# Patient Record
Sex: Female | Born: 1990 | Race: White | Hispanic: No | Marital: Married | Smoking: Former smoker
Health system: Southern US, Community
[De-identification: ages and names within clinical notes are randomized; demographics above are authoritative.]

## PROBLEM LIST (undated history)

## (undated) DIAGNOSIS — D649 Anemia, unspecified: Secondary | ICD-10-CM

## (undated) HISTORY — DX: Anemia, unspecified: D64.9

---
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
## 2012-02-05 ENCOUNTER — Emergency Department: Payer: Self-pay | Admitting: Emergency Medicine

## 2013-02-04 ENCOUNTER — Emergency Department: Payer: Self-pay | Admitting: Emergency Medicine

## 2013-02-04 LAB — CBC
HCT: 40.3 % (ref 35.0–47.0)
MCH: 28 pg (ref 26.0–34.0)
MCHC: 32.9 g/dL (ref 32.0–36.0)
Platelet: 230 10*3/uL (ref 150–440)
RDW: 13.3 % (ref 11.5–14.5)
WBC: 13.5 10*3/uL — ABNORMAL HIGH (ref 3.6–11.0)

## 2013-02-04 LAB — COMPREHENSIVE METABOLIC PANEL
Anion Gap: 11 (ref 7–16)
BUN: 8 mg/dL (ref 7–18)
Calcium, Total: 9.2 mg/dL (ref 8.5–10.1)
Co2: 21 mmol/L (ref 21–32)
Glucose: 79 mg/dL (ref 65–99)
Osmolality: 269 (ref 275–301)
Potassium: 3.8 mmol/L (ref 3.5–5.1)
SGOT(AST): 20 U/L (ref 15–37)
Sodium: 136 mmol/L (ref 136–145)
Total Protein: 7.8 g/dL (ref 6.4–8.2)

## 2013-02-04 LAB — URINALYSIS, COMPLETE
Blood: NEGATIVE
Nitrite: NEGATIVE
RBC,UR: 1 /HPF (ref 0–5)
Specific Gravity: 1.015 (ref 1.003–1.030)
Squamous Epithelial: 10

## 2013-02-18 ENCOUNTER — Emergency Department: Payer: Self-pay | Admitting: Internal Medicine

## 2013-03-09 ENCOUNTER — Encounter: Payer: Self-pay | Admitting: Maternal and Fetal Medicine

## 2013-04-23 ENCOUNTER — Encounter: Payer: Self-pay | Admitting: Obstetrics & Gynecology

## 2013-04-26 ENCOUNTER — Encounter: Payer: Self-pay | Admitting: Obstetrics & Gynecology

## 2013-06-28 ENCOUNTER — Emergency Department: Payer: Self-pay | Admitting: Emergency Medicine

## 2013-07-31 ENCOUNTER — Emergency Department: Payer: Self-pay | Admitting: Emergency Medicine

## 2013-09-21 ENCOUNTER — Observation Stay: Payer: Self-pay | Admitting: Advanced Practice Midwife

## 2013-09-22 ENCOUNTER — Inpatient Hospital Stay: Payer: Self-pay | Admitting: Obstetrics & Gynecology

## 2013-09-22 LAB — CBC WITH DIFFERENTIAL/PLATELET
HCT: 36 % (ref 35.0–47.0)
Lymphocyte %: 14.4 %
MCHC: 33.5 g/dL (ref 32.0–36.0)
Monocyte #: 0.8 x10 3/mm (ref 0.2–0.9)
Monocyte %: 4.7 %
Neutrophil #: 13.3 10*3/uL — ABNORMAL HIGH (ref 1.4–6.5)
Neutrophil %: 80.1 %
Platelet: 189 10*3/uL (ref 150–440)
RDW: 14.9 % — ABNORMAL HIGH (ref 11.5–14.5)

## 2013-09-23 LAB — GC/CHLAMYDIA PROBE AMP

## 2013-09-24 LAB — HEMATOCRIT: HCT: 29.8 % — ABNORMAL LOW (ref 35.0–47.0)

## 2014-03-31 ENCOUNTER — Emergency Department: Payer: Self-pay | Admitting: Emergency Medicine

## 2015-03-18 NOTE — Op Note (Signed)
PATIENT NAME:  Adrienne Berry, Adrienne Berry MR#:  [id number] DATE OF BIRTH:  05/18/1991  DATE OF PROCEDURE:  09/23/2013  PREOPERATIVE DIAGNOSES: 1.  Term intrauterine pregnancy at [redacted] weeks gestation.  2.  Active phase arrest.   POSTOPERATIVE DIAGNOSES:   1.  Term intrauterine pregnancy at [redacted] weeks gestation.  2.  Active phase arrest.   PROCEDURE PERFORMED: Primary low transverse cesarean section via Pfannenstiel skin incision.   ANESTHESIA: Spinal epidural.   PRIMARY SURGEON: Florina Ou. Camrie Stock   ASSISTANT:  Joaquim Lai  certified nurse midwife.   ESTIMATED BLOOD LOSS: 700 mL.  OPERATIVE FLUIDS: 1 liter of crystalloid.   SPECIMENS REMOVED: None.   PREOPERATIVE ANTIBIOTICS: 3 grams Ancef.   DRAINS OR TUBES:  Foley to gravity drainage, On-Q catheter system.   COMPLICATIONS: None.   INTRAOPERATIVE FINDINGS: Normal tubes, ovaries, and uterus. Delivery resulting in the birth of a liveborn female infant weighing 3020 grams, 6 pounds 11 ounces. Apgars 8 and 9.   SPECIMENS REMOVED: None.   THE PATIENT'S CONDITION FOLLOWING PROCEDURES: Stable.   PROCEDURE IN DETAIL: Risks, benefits, and alternatives of the procedure were discussed with the patient prior to proceeding to the operating room. The patient was taken to the operating room where her epidural was dosed for a C-section. She was positioned in the supine position, prepped and draped in the usual sterile fashion. A timeout was performed. The level of anesthetic was checked and noted to be adequate prior to proceeding with the case. A Pfannenstiel skin incision was made 2 cm above the pubic symphysis, carried down sharply to the level of the rectus fascia using a scalpel. The fascia was incised in the midline with the scalpel and the fascial incision was extended using Mayo scissors. The superior border of the rectus fascia was grasped with two Kocher clamps. The underlying rectus muscles were dissected off the fascia using blunt dissection.  The median raphe was incised using Mayo scissors. The inferior border of the rectus fascia was dissected off the rectus muscles in a similar fashion. Midline was identified. The peritoneum was entered bluntly. The peritoneal incision was extended using manual traction. A large Alexis retractor was then placed to allow visualization of the operative field and lower uterine segment. A low transverse incision was made on the uterus and the uterus was entered bluntly using the operator's finger. The hysterotomy incision was extended using manual traction. Amniotomy did not need to be performed as the patient was ruptured but the fluid was noted to be clear. The vertex was grasped, flexed, brought to the incision, delivered atraumatically using fundal pressure. The infant was suctioned, cord was clamped and cut and the infant was passed to the awaiting pediatrician. Cord blood was obtained. The placenta was delivered using manual extraction. The uterus was repaired in situ using a 2 layer closure of 0 Vicryl with the first being a running locked, the second a vertical imbricating. The uterus was wiped clean of clots and debris with 2 moist laps prior to closure of the hysterotomy incision. The hysterotomy was inspected and noted to be hemostatic. The fascia was grasped with 2 Kocher clamps and the On-Q catheters were placed subfascially per the usual protocol. Fascia was closed using a single looped #1 PDS in a running fashion. The subcutaneous tissue was irrigated. The subcutaneous dead space was reapproximated using 2-0 chromic on a T53 needle. Subdermal sutures were placed to take tension off the incision using a 2-0 Vicryl. The skin was then closed using 4-0  Monocryl in a subcuticular fashion. Each On-Q catheter was bolused with 5 mL of 0.5% bupivacaine each. The incision was reinforced with Steri-Strips. Sponge, needle, and instrument counts were correct x 2. The patient tolerated the procedure well and was taken to  the recovery room in stable condition.   ____________________________ Florina OuAndreas M. Bonney AidStaebler, MD ams:dp D: 09/29/2013 15:06:58 ET T: 09/29/2013 15:26:21 ET JOB#: [id number]  cc: Florina OuAndreas M. Bonney AidStaebler, MD, <Dictator> Carmel SacramentoANDREAS Cathrine MusterM Maveryk Renstrom MD ELECTRONICALLY SIGNED 10/07/2013 8:56

## 2015-03-18 NOTE — Consult Note (Signed)
PATIENT NAME:  Adrienne Berry, Adrienne Berry MR#:  [id number] DATE OF BIRTH:  [birth year]  DATE OF CONSULTATION:  09/26/2013  REFERRING PHYSICIAN:  Dr. Patton SallesWeaver-Lee CONSULTING PHYSICIAN:  Duane LopeJeffrey D. Judithann SheenSparks, MD  REASON FOR CONSULTATION:  Rash.  HISTORY OF PRESENT ILLNESS:  The patient is a 24 year old female who is now three days postpartum after C-section with a delivery of a healthy baby boy.  Has developed a rash on her buttocks extending down into the posterior thighs which is extremely irritating and pruritic.  No fevers.  No nausea, vomiting.  Denies shortness of breath or cough.    PAST MEDICAL HISTORY:  Unremarkable.   MEDICATIONS ON ADMISSION:  None.   ALLERGIES:  No known drug allergies.   SOCIAL HISTORY:  Negative for alcohol or tobacco abuse.   REVIEW OF SYSTEMS:  As per HPI.   PHYSICAL EXAMINATION: GENERAL:  The patient is in no acute distress.  VITAL SIGNS:  Currently remarkable for a blood pressure of 120/60 with a heart rate of 92, temperature of 98.2.  HEENT:  Oropharynx clear.  NECK:  Supple.  No adenopathy.  LUNGS:  Clear.  CARDIAC:  Regular rate and rhythm.  Normal S1, S2.  ABDOMEN:  Soft and nontender.  EXTREMITIES:  Stasis changes with trace edema.  SKIN:  Reveals a diffuse, erythematous, warm rash noted on the buttocks bilaterally extending down to the posterior thighs.  There are some mild excoriations from where she has scratched.  There are no discrete vesicles.  No purulence.   ASSESSMENT:  Postpartum rash consistent with erysipelas.   PLAN:  The patient was instructed to keep the area clean and dry.  She can use Desitin ointment if needed.  Atarax 25 mg every four hours as needed for itching.  Begin Ceftin 500 mg by mouth twice daily for 10 days.  She should follow up with a dermatologist within 7 to 10 days, sooner if needed.   Thank you for the consultation.  Please call if questions arise.    ____________________________ Duane LopeJeffrey D. Judithann SheenSparks,  MD jds:ea D: 09/26/2013 19:14:48 ET T: 09/26/2013 23:57:36 ET JOB#: [id number]  cc: Duane LopeJeffrey D. Judithann SheenSparks, MD, <Dictator> Christa Fasig Rodena Medin Dwaine Pringle MD ELECTRONICALLY SIGNED 09/27/2013 10:53

## 2015-03-18 NOTE — Consult Note (Signed)
Chief Complaint:  Subjective/Chief Complaint Rash on buttocks and legs   VITAL SIGNS/ANCILLARY NOTES: **Vital Signs.:   01-Nov-14 04:07  Temperature Temperature (F) 98.6  Pulse Pulse 92  Systolic BP Systolic BP 127  Diastolic BP (mmHg) Diastolic BP (mmHg) 87    07:03  Temperature Temperature (F) 97.4  Pulse Pulse 92  Systolic BP Systolic BP 110  Diastolic BP (mmHg) Diastolic BP (mmHg) 73  Pulse Ox % Pulse Ox % 100  Oxygen Delivery Room Air/ 21 %    11:16  Temperature Temperature (F) 97.9  Pulse Pulse 92  Systolic BP Systolic BP 115  Diastolic BP (mmHg) Diastolic BP (mmHg) 79  Pulse Ox % Pulse Ox % 98  Oxygen Delivery Room Air/ 21 %    15:34  Temperature Temperature (F) 98.2  Pulse Pulse 107  Systolic BP Systolic BP 120  Diastolic BP (mmHg) Diastolic BP (mmHg) 62  Pulse Ox % Pulse Ox % 99  Oxygen Delivery Room Air/ 21 %   Brief Assessment:  GEN no acute distress   Cardiac Regular   Respiratory clear BS   Gastrointestinal Normal   Additional Physical Exam erythematous rash to buttocks and posterior thighs. No vesicles. Some warmth.   Assessment/Plan:  Assessment/Plan:  Assessment erysipelas   Plan Ceftin 500mg  po BID x 10 days f/u with Derm as outpatient in 7-10 days   Electronic Signatures: Marguarite ArbourSparks, Ciro Tashiro D (MD)  (Signed 01-Nov-14 19:11)  Authored: Chief Complaint, VITAL SIGNS/ANCILLARY NOTES, Brief Assessment, Assessment/Plan   Last Updated: 01-Nov-14 19:11 by Marguarite ArbourSparks, Peyton Rossner D (MD)

## 2015-04-05 NOTE — H&P (Signed)
L&D Evaluation:  History Expanded:  HPI 24 yo G1P0 at 3540 w 3 days who was seen last night also and was 2 cm . She is having very irregular contractions. HEr cervix tonight os three and patient is very comfortably talkign with family membersexcwept for ctx. She has a BMI of 40 and she is RH neg, she got tdap at 07/02/13. 1st trimester screenign was negative, the pt was a smoker but stopped with pregnancy her partner still smokes.   Gravida 1   Term 0   PreTerm 0   Abortion 0   Living 0   Blood Type (Maternal) O negative   Maternal HIV Negative   Maternal Syphilis Ab Nonreactive   Maternal Varicella Immune   Rubella Results (Maternal) unknown, needs titer   Maternal T-Dap Immune   [facility]   Presents with contractions   Patient's Medical History migraines   Patient's Surgical History none   Medications Pre Natal Vitamins  Valtrex   Allergies NKDA   Social History tobacco  before pregnancy   Family History Non-Contributory   Current Prenatal Course Notable For obesity,   ROS:  ROS All systems were reviewed.  HEENT, CNS, GI, GU, Respiratory, CV, Renal and Musculoskeletal systems were found to be normal.   Exam:  Vital Signs stable    Urine Protein not completed   General no apparent distress, x ctx   Mental Status clear    Chest clear    Heart normal sinus rhythm   Abdomen gravid, tender with contractions   Estimated Fetal Weight Average for gestational age   Fetal Position v   Back no CVAT   Edema no edema    Reflexes 1+    Clonus positive   Pelvic no external lesions   Mebranes Intact   FHT normal rate with no decels, cat i strictly reactiove   Fetal Heart Rate 140    Ucx regular   Skin dry   Lymph no lymphadenopathy    Impression:  Impression active labor   Plan:  Plan monitor contractions and for cervical change   Comments admit for labor desires epidural   Follow Up Appointment need to schedule. in 6 weeks    Electronic Signatures: Adria DevonKlett, Marvelous [place] (MD)  (Signed 28-Oct-14 22:03)  Authored: L&D Evaluation   Last Updated: 28-Oct-14 22:03 by Adria DevonKlett, [place] (MD)

## 2018-08-04 ENCOUNTER — Emergency Department
Admission: EM | Admit: 2018-08-04 | Discharge: 2018-08-04 | Disposition: A | Discharge status: Home / Self Care | Payer: Self-pay | Attending: Emergency Medicine | Admitting: Emergency Medicine

## 2018-08-04 ENCOUNTER — Other Ambulatory Visit: Payer: Self-pay

## 2018-08-04 ENCOUNTER — Encounter: Payer: Self-pay | Admitting: Emergency Medicine

## 2018-08-04 DIAGNOSIS — R112 Nausea with vomiting, unspecified: Secondary | ICD-10-CM | POA: Insufficient documentation

## 2018-08-04 DIAGNOSIS — M7918 Myalgia, other site: Secondary | ICD-10-CM | POA: Insufficient documentation

## 2018-08-04 DIAGNOSIS — F17200 Nicotine dependence, unspecified, uncomplicated: Secondary | ICD-10-CM | POA: Insufficient documentation

## 2018-08-04 LAB — URINALYSIS, COMPLETE (UACMP) WITH MICROSCOPIC
Bilirubin Urine: NEGATIVE
GLUCOSE, UA: NEGATIVE mg/dL
Ketones, ur: NEGATIVE mg/dL
LEUKOCYTES UA: NEGATIVE
Nitrite: NEGATIVE
PH: 6 (ref 5.0–8.0)
Protein, ur: 30 mg/dL — AB
SPECIFIC GRAVITY, URINE: 1.024 (ref 1.005–1.030)

## 2018-08-04 LAB — CBC
HCT: 38.8 % (ref 35.0–47.0)
Hemoglobin: 13.6 g/dL (ref 12.0–16.0)
MCH: 29.8 pg (ref 26.0–34.0)
MCHC: 35.1 g/dL (ref 32.0–36.0)
MCV: 84.8 fL (ref 80.0–100.0)
Platelets: 202 10*3/uL (ref 150–440)
RBC: 4.58 MIL/uL (ref 3.80–5.20)
RDW: 12.9 % (ref 11.5–14.5)
WBC: 8.9 10*3/uL (ref 3.6–11.0)

## 2018-08-04 LAB — COMPREHENSIVE METABOLIC PANEL
ALT: 31 U/L (ref 0–44)
AST: 23 U/L (ref 15–41)
Albumin: 3.9 g/dL (ref 3.5–5.0)
Alkaline Phosphatase: 89 U/L (ref 38–126)
Anion gap: 8 (ref 5–15)
BUN: 9 mg/dL (ref 6–20)
CHLORIDE: 106 mmol/L (ref 98–111)
CO2: 25 mmol/L (ref 22–32)
Calcium: 8.9 mg/dL (ref 8.9–10.3)
Creatinine, Ser: 0.75 mg/dL (ref 0.44–1.00)
GFR calc Af Amer: >60 mL/min (ref >60)
Glucose, Bld: 99 mg/dL (ref 70–99)
Potassium: 3.6 mmol/L (ref 3.5–5.1)
Sodium: 139 mmol/L (ref 135–145)
Total Bilirubin: 0.7 mg/dL (ref 0.3–1.2)
Total Protein: 7.4 g/dL (ref 6.5–8.1)

## 2018-08-04 LAB — LIPASE, BLOOD: LIPASE: 21 U/L (ref 11–51)

## 2018-08-04 LAB — POCT PREGNANCY, URINE: Preg Test, Ur: NEGATIVE

## 2018-08-04 MED ORDER — ONDANSETRON 4 MG PO TBDP
4.0000 mg | ORAL_TABLET | Freq: Once | ORAL | Status: AC
  Administered 2018-08-04: 4 mg via ORAL
  Filled 2018-08-04: qty 1

## 2018-08-04 MED ORDER — ONDANSETRON 4 MG PO TBDP: 4.0000 mg | ORAL_TABLET | Freq: Three times a day (TID) | ORAL | 0 refills | Status: DC | PRN

## 2018-08-04 NOTE — ED Triage Notes (Signed)
Here for vomiting. Denies diarrhea. No abdominal pain just sore from vomiting. Is keeping liquids down. VSS. NAD. Unlabored.

## 2018-08-04 NOTE — ED Notes (Signed)
PO challenged with Sprite

## 2018-08-04 NOTE — ED Provider Notes (Signed)
[facility] Emergency Department Provider Note  Time seen: 10:42 AM  I have reviewed the triage vital signs and the nursing notes.   HISTORY  Chief Complaint Emesis    HPI Adrienne Berry is a 27 y.o. female with no past medical history presents to the emergency department for nausea vomiting since yesterday.  According to the patient since yesterday morning she has been nauseated with frequent episodes of vomiting.  Patient denies any diarrhea denies any fever.  States she has had some body aches.  Patient states she thought it would go away overnight but woke up continuing to feel nauseated.  States she has been able to drink some ginger ale today but due to the continued feeling of nausea she came to the emergency department for evaluation.  Patient denies any abdominal pain fever or diarrhea.  Largely negative review of systems.   History reviewed. No pertinent past medical history.  There are no active problems to display for this patient.   Past Surgical History:  Procedure Laterality Date  . CESAREAN SECTION      Prior to Admission medications   Not on File    No Known Allergies  History reviewed. No pertinent family history.  Social History Social History   Tobacco Use  . Smoking status: Current Every Day Smoker  . Smokeless tobacco: Never Used  Substance Use Topics  . Alcohol use: Never    Frequency: Never  . Drug use: Never    Review of Systems Constitutional: Negative for fever. Cardiovascular: Negative for chest pain. Respiratory: Negative for shortness of breath. Gastrointestinal: Negative for abdominal pain.  Positive for nausea vomiting.  Negative for diarrhea. Genitourinary: Negative for urinary compaints.  Last menstrual period 2 weeks ago.  Denies vaginal discharge. Musculoskeletal: Negative for musculoskeletal complaints Skin: Negative for skin complaints  Neurological: Negative for headache All other ROS  negative  ____________________________________________   PHYSICAL EXAM:  VITAL SIGNS: ED Triage Vitals  Enc Vitals Group     BP 08/04/18 0930 (!) 117/47     Pulse Rate 08/04/18 0930 98     Resp 08/04/18 0930 20     Temp 08/04/18 0930 98.1 F (36.7 C)     Temp Source 08/04/18 0930 Oral     SpO2 08/04/18 0930 100 %     Weight 08/04/18 0930 (!) 305 lb (138.3 kg)     Height 08/04/18 0930 6' (1.829 m)     Head Circumference --      Peak Flow --      Pain Score 08/04/18 0931 0     Pain Loc --      Pain Edu? --      Excl. in GC? --    Constitutional: Alert and oriented. Well appearing and in no distress. Eyes: Normal exam ENT   Head: Normocephalic and atraumatic.   Mouth/Throat: Mucous membranes are moist. Cardiovascular: Normal rate, regular rhythm. No murmur Respiratory: Normal respiratory effort without tachypnea nor retractions. Breath sounds are clear  Gastrointestinal: Soft and nontender. No distention.  Musculoskeletal: Nontender with normal range of motion in all extremities.  Neurologic:  Normal speech and language. No gross focal neurologic deficits  Skin:  Skin is warm, dry and intact.  Psychiatric: Mood and affect are normal.   ____________________________________________   INITIAL IMPRESSION / ASSESSMENT AND PLAN / ED COURSE  Pertinent labs & imaging results that were available during my care of the patient were reviewed by me and considered in my medical decision  making (see chart for details).  Patient presents to the emergency department for nausea vomiting since yesterday morning.  Overall the patient appears very well.  Labs are reassuring.  I discussed with the patient IV placement for medications and fluids, patient states she has been able to keep down ginger ale today, and would prefer to try taking a nausea tablet.  We will dose ODT Zofran and attempt to have the patient drink oral fluids.  As long as the patient is able to adequately hydrate  without significant nausea or vomiting I anticipate likely discharge home with PCP follow-up.  Patient agreeable to plan of care.  Patient is feeling much better.  We will discharge with Zofran and PCP follow-up.  I discussed return precautions.  ____________________________________________   FINAL CLINICAL IMPRESSION(S) / ED DIAGNOSES  Nausea  Vomiting    Minna Antis, MD 08/04/18 1200

## 2019-08-10 ENCOUNTER — Ambulatory Visit: Payer: Medicaid Other | Admitting: Maternal Newborn

## 2019-08-19 ENCOUNTER — Ambulatory Visit (INDEPENDENT_AMBULATORY_CARE_PROVIDER_SITE_OTHER): Payer: Medicaid Other | Admitting: Maternal Newborn

## 2019-08-19 ENCOUNTER — Other Ambulatory Visit: Payer: Self-pay

## 2019-08-19 ENCOUNTER — Other Ambulatory Visit (HOSPITAL_COMMUNITY)
Admission: RE | Admit: 2019-08-19 | Discharge: 2019-08-19 | Disposition: A | Discharge status: Home / Self Care | Payer: Self-pay | Source: Ambulatory Visit | Attending: Maternal Newborn | Admitting: Maternal Newborn

## 2019-08-19 ENCOUNTER — Encounter: Payer: Self-pay | Admitting: Maternal Newborn

## 2019-08-19 VITALS — BP 120/80 | Ht 72.0 in | Wt 302.0 lb

## 2019-08-19 DIAGNOSIS — Z130 Encounter for screening for diseases of the blood and blood-forming organs and certain disorders involving the immune mechanism: Secondary | ICD-10-CM

## 2019-08-19 DIAGNOSIS — R102 Pelvic and perineal pain: Secondary | ICD-10-CM | POA: Insufficient documentation

## 2019-08-19 DIAGNOSIS — N939 Abnormal uterine and vaginal bleeding, unspecified: Secondary | ICD-10-CM

## 2019-08-19 DIAGNOSIS — Z124 Encounter for screening for malignant neoplasm of cervix: Secondary | ICD-10-CM

## 2019-08-19 DIAGNOSIS — Z0000 Encounter for general adult medical examination without abnormal findings: Secondary | ICD-10-CM

## 2019-08-19 DIAGNOSIS — Z01419 Encounter for gynecological examination (general) (routine) without abnormal findings: Secondary | ICD-10-CM

## 2019-08-19 HISTORY — DX: Abnormal uterine and vaginal bleeding, unspecified: N93.9

## 2019-08-19 NOTE — Progress Notes (Signed)
Gynecology Annual Exam  PCP: Patient, No Pcp Per  Chief Complaint:  Chief Complaint  Patient presents with  . Gynecologic Exam  . Menstrual Problem  . Abdominal Pain    History of Present Illness: Patient is a 28 y.o. G1P1001 presenting for an annual exam.   LMP: 08/05/2019 Average Interval: irregular, every 1.5 to 2 weeks Duration of flow: 6 days Heavy Menses: yes Clots: yes Intermenstrual Bleeding: no Postcoital Bleeding: no Dysmenorrhea: yes  She reports that she has had irregular cycles for the past 6 months, with cycles every 1.5-2 weeks (occasionally has a month with a regular cycle which is 23 days for her). Her bleeding lasts for 6 days, with heavy bleeding/clots for about 4 days per cycle. During this time, she has also had intermittent sharp pain on the right side of her pelvis, which happens while the second monthly cycle is ongoing. She rates the pain as 6/10. She has tried Midol, which lessened the pain a little but did not relieve it.  She reports that her appetite has been low and she has lost 13 lb in a week.  The patient is sexually active. She currently uses none for contraception. She has mild dyspareunia.  The patient does perform self breast exams.  There is no notable family history of breast or ovarian cancer in her family.  The patient wears seatbelts: yes.   The patient has regular exercise: no, but recently has started a job where she is active at work.    The patient does not have current symptoms of depression.    Domestic violence screening is negative.  Review of Systems  Constitutional: Positive for malaise/fatigue and weight loss.  HENT: Negative.   Eyes: Negative.   Respiratory: Negative for shortness of breath and wheezing.   Cardiovascular: Negative for chest pain and palpitations.  Gastrointestinal: Negative.   Genitourinary:       Abnormal uterine bleeding, heavy cycles every 2 weeks Right sided pelvic pain  Musculoskeletal:  Negative.   Skin: Negative.   Neurological: Negative.   Endo/Heme/Allergies: Negative.   Psychiatric/Behavioral: Negative.   All other systems reviewed and are negative.   Past Medical History:  History reviewed. No pertinent past medical history.  Past Surgical History:  Past Surgical History:  Procedure Laterality Date  . CESAREAN SECTION      Gynecologic History:  Contraception: none Last Pap: 6 years ago  Obstetric History: G1P1001  Family History:  History reviewed. No pertinent family history.  Social History:  Social History   Socioeconomic History  . Marital status: Married    Spouse name: Not on file  . Number of children: Not on file  . Years of education: Not on file  . Highest education level: Not on file  Occupational History  . Not on file  Social Needs  . Financial resource strain: Not on file  . Food insecurity    Worry: Not on file    Inability: Not on file  . Transportation needs    Medical: Not on file    Non-medical: Not on file  Tobacco Use  . Smoking status: Current Every Day Smoker  . Smokeless tobacco: Never Used  Substance and Sexual Activity  . Alcohol use: Never    Frequency: Never  . Drug use: Never  . Sexual activity: Yes    Birth control/protection: None  Lifestyle  . Physical activity    Days per week: Not on file    Minutes per session: Not  on file  . Stress: Not on file  Relationships  . Social Herbalist on phone: Not on file    Gets together: Not on file    Attends religious service: Not on file    Active member of club or organization: Not on file    Attends meetings of clubs or organizations: Not on file    Relationship status: Not on file  . Intimate partner violence    Fear of current or ex partner: Not on file    Emotionally abused: Not on file    Physically abused: Not on file    Forced sexual activity: Not on file  Other Topics Concern  . Not on file  Social History Narrative  . Not on file     Allergies:  No Known Allergies  Medications: Prior to Admission medications   Medication Sig Start Date End Date Taking? Authorizing Provider  ondansetron (ZOFRAN ODT) 4 MG disintegrating tablet Take 1 tablet (4 mg total) by mouth every 8 (eight) hours as needed for nausea or vomiting. 08/04/18   Harvest Dark, MD    Physical Exam Vitals: Blood pressure 120/80, height 6' (1.829 m), weight (!) 302 lb (137 kg).  Physical Exam Constitutional:      General: She is not in acute distress.    Appearance: Normal appearance. She is obese.  Genitourinary:     Vulva, urethra, cervix, uterus and rectum normal.     No vaginal discharge.     Uterus is not enlarged or tender.     Left adnexa are non-palpable.     Right adnexal mass present.     Right adnexa tender.     Left adnexa not tender.     Genitourinary Comments: Exam limited by body habitus  HENT:     Head: Normocephalic.  Neck:     Musculoskeletal: Normal range of motion and neck supple.  Cardiovascular:     Rate and Rhythm: Normal rate and regular rhythm.     Heart sounds: No murmur. No friction rub. No gallop.   Pulmonary:     Effort: Pulmonary effort is normal.     Breath sounds: Normal breath sounds.  Abdominal:     General: There is no distension.     Palpations: Abdomen is soft.     Tenderness: There is no abdominal tenderness.  Musculoskeletal: Normal range of motion.  Neurological:     Mental Status: She is oriented to person, place, and time.  Skin:    General: Skin is warm and dry.  Psychiatric:        Mood and Affect: Mood normal.        Behavior: Behavior normal.    Assessment: 28 y.o. G1P1001 annual exam with abnormal uterine bleeding and pelvic pain.  Plan: Problem List Items Addressed This Visit      Genitourinary   Abnormal uterine bleeding     Other   Pelvic pain   Relevant Orders   US PELVIS TRANSVAGINAL NON-OB (TV ONLY)    Other Visit Diagnoses    Encounter for annual routine  gynecological examination    -  Primary   Screening for iron deficiency anemia       Relevant Orders   CBC   Pap smear for cervical cancer screening       Relevant Orders   Cytology - PAP      1) STI screening was offered and declined  2) ASCCP guidelines and rationale discussed.  Patient  opts for every 3 year screening interval; Pap done today  3) Contraception - She is trying to conceive.  4) Screen for anemia today  5) Pelvic ultrasound ordered for pelvic pain and irregular bleeding, tenderness and palpable mass on right side during exam.  6) Discussed OCP as therapy for irregular, heavy menses, but she is trying to conceive and would prefer to wait for results of pelvic ultrasound before deciding on therapy.  Marcelyn Bruins, CNM 08/19/2019  11:54 AM

## 2019-08-20 LAB — CBC
Hematocrit: 41 % (ref 34.0–46.6)
Hemoglobin: 13.4 g/dL (ref 11.1–15.9)
MCH: 28.8 pg (ref 26.6–33.0)
MCHC: 32.7 g/dL (ref 31.5–35.7)
MCV: 88 fL (ref 79–97)
Platelets: 241 10*3/uL (ref 150–450)
RBC: 4.66 x10E6/uL (ref 3.77–5.28)
RDW: 12.4 % (ref 11.7–15.4)
WBC: 10.6 10*3/uL (ref 3.4–10.8)

## 2019-09-02 ENCOUNTER — Ambulatory Visit (LOCAL_COMMUNITY_HEALTH_CENTER): Payer: Self-pay

## 2019-09-02 ENCOUNTER — Other Ambulatory Visit: Payer: Self-pay

## 2019-09-02 VITALS — BP 110/70 | Ht 72.0 in | Wt 297.5 lb

## 2019-09-02 DIAGNOSIS — Z3201 Encounter for pregnancy test, result positive: Secondary | ICD-10-CM

## 2019-09-02 LAB — PREGNANCY, URINE: Preg Test, Ur: POSITIVE — AB

## 2019-09-02 NOTE — Progress Notes (Signed)
PNV not dispensed as currently taking PNV with iron gummies. Rich Number, RN

## 2019-09-09 ENCOUNTER — Ambulatory Visit (INDEPENDENT_AMBULATORY_CARE_PROVIDER_SITE_OTHER): Payer: Self-pay | Admitting: Obstetrics and Gynecology

## 2019-09-09 ENCOUNTER — Encounter: Payer: Self-pay | Admitting: Obstetrics and Gynecology

## 2019-09-09 ENCOUNTER — Other Ambulatory Visit: Payer: Self-pay

## 2019-09-09 VITALS — BP 109/61 | Wt 299.0 lb

## 2019-09-09 DIAGNOSIS — N912 Amenorrhea, unspecified: Secondary | ICD-10-CM

## 2019-09-09 DIAGNOSIS — Z3A01 Less than 8 weeks gestation of pregnancy: Secondary | ICD-10-CM

## 2019-09-09 DIAGNOSIS — Z23 Encounter for immunization: Secondary | ICD-10-CM

## 2019-09-09 DIAGNOSIS — O34219 Maternal care for unspecified type scar from previous cesarean delivery: Secondary | ICD-10-CM

## 2019-09-09 DIAGNOSIS — O99211 Obesity complicating pregnancy, first trimester: Secondary | ICD-10-CM

## 2019-09-09 DIAGNOSIS — Z3689 Encounter for other specified antenatal screening: Secondary | ICD-10-CM

## 2019-09-09 DIAGNOSIS — Z3201 Encounter for pregnancy test, result positive: Secondary | ICD-10-CM

## 2019-09-09 DIAGNOSIS — Z3480 Encounter for supervision of other normal pregnancy, unspecified trimester: Secondary | ICD-10-CM

## 2019-09-09 DIAGNOSIS — Z98891 History of uterine scar from previous surgery: Secondary | ICD-10-CM

## 2019-09-09 DIAGNOSIS — O99210 Obesity complicating pregnancy, unspecified trimester: Secondary | ICD-10-CM

## 2019-09-09 DIAGNOSIS — Z113 Encounter for screening for infections with a predominantly sexual mode of transmission: Secondary | ICD-10-CM

## 2019-09-09 LAB — POCT URINE PREGNANCY: Preg Test, Ur: POSITIVE — AB

## 2019-09-09 MED ORDER — FOLIC ACID 1 MG PO TABS: 1.0000 mg | ORAL_TABLET | Freq: Every day | ORAL | 10 refills | Status: DC

## 2019-09-09 NOTE — Progress Notes (Signed)
NOB- no concerns/flu shot today

## 2019-09-09 NOTE — Progress Notes (Signed)
New Obstetric Patient H&P    Chief Complaint: "Desires prenatal care"   History of Present Illness: Patient is a 28 y.o. G2P1001 Not Hispanic or Latino female, presents with amenorrhea and positive home pregnancy test. Patient's last menstrual period was 07/28/2019 (exact date). and based on her  LMP, her EDD is Estimated Date of Delivery: 05/03/20 and her EGA is 5169w1d. Her last pap smear on 08/19/2019 has not yet resulted.    She had a urine pregnancy test which was positive 1 week(s)  ago. Her last menstrual period was normal. Since her LMP she claims she has experienced minimal nausea. She denies vaginal bleeding. Her past medical history is contibutory for Body mass index is 40.55 kg/m.Marland Kitchen. 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 no  Genetic Screening/Teratology Counseling: (Includes patient, baby's father, or anyone in either family with:)   1. [facility]/= 735 [facility]  no 2. Thalassemia ([place], [place], Mediterranean, or Asian background): MCV<80  no 3. Neural tube defect (meningomyelocele, spina bifida, anencephaly)  no 4. Congenital heart defect  no  5. Down syndrome  no 6. Tay-Sachs (Jewish, [place])  no 7. Canavan's Disease  no 8. Sickle cell disease or trait (African)  no  9. Hemophilia or other blood disorders  no  10. Muscular dystrophy  no  11. Cystic fibrosis  no  12. Huntington's Chorea  no  13. Mental retardation/autism  no 14. Other inherited genetic or chromosomal disorder  no 15. Maternal metabolic disorder (DM, PKU,  etc)  no 16. Patient or FOB with a child with a birth defect not listed above no  16a. Patient or FOB with a birth defect themselves no 17. Recurrent pregnancy loss, or stillbirth  no  18. Any medications since LMP other than prenatal vitamins (include vitamins, supplements, OTC meds, drugs, alcohol)  no 19. Any other genetic/environmental exposure to discuss  no  Infection History:   1. Lives with someone with TB or TB exposed  no  2. Patient or partner has history of genital herpes  no 3. Rash or viral illness since LMP  no 4. History of STI (GC, CT, HPV, syphilis, HIV)  no 5. History of recent travel :  no  Other pertinent information:  no     Review of Systems:10 point review of systems negative unless otherwise noted in HPI  Past Medical History:  Past Medical History:  Diagnosis Date  . Anemia     Past Surgical History:  Past Surgical History:  Procedure Laterality Date  . CESAREAN SECTION      Gynecologic History: Patient's last menstrual period was 07/28/2019 (exact date).  Obstetric History: G2P1001  Family History:  Family History  Problem Relation Age of Onset  . Ovarian cysts Mother   . Ovarian cysts Maternal Aunt     Social History:  Social History   Socioeconomic History  . Marital status: Married    Spouse name: Not on file  . Number of children: Not on file  .  Years of education: Not on file  . Highest education level: Not on file  Occupational History  . Not on file  Social Needs  . Financial resource strain: Not on file  . Food insecurity    Worry: Not on file    Inability: Not on file  . Transportation needs    Medical: Not on file    Non-medical: Not on file  Tobacco Use  . Smoking status: Former Smoker    Packs/day: 0.50    Years: 12.00    Pack years: 6.00  . Smokeless tobacco: Never Used  Substance and Sexual Activity  . Alcohol use: Not Currently    Frequency: Never    Comment: Last ETOH use one month ago  . Drug use:  Never  . Sexual activity: Yes    Partners: Male    Birth control/protection: None  Lifestyle  . Physical activity    Days per week: Not on file    Minutes per session: Not on file  . Stress: Not on file  Relationships  . Social Musician on phone: Not on file    Gets together: Not on file    Attends religious service: Not on file    Active member of club or organization: Not on file    Attends meetings of clubs or organizations: Not on file    Relationship status: Not on file  . Intimate partner violence    Fear of current or ex partner: No    Emotionally abused: No    Physically abused: No    Forced sexual activity: No  Other Topics Concern  . Not on file  Social History Narrative  . Not on file    Allergies:  No Known Allergies  Medications: Prior to Admission medications   Medication Sig Start Date End Date Taking? Authorizing Provider  prenatal vitamin w/FE, FA (NATACHEW) 29-1 MG CHEW chewable tablet Chew 1 tablet by mouth daily at 12 noon. Taking Prenatal Vitamin with iron gummies.   Yes [provider]  ondansetron (ZOFRAN ODT) 4 MG disintegrating tablet Take 1 tablet (4 mg total) by mouth every 8 (eight) hours as needed for nausea or vomiting. Patient not taking: Reported on 08/19/2019 08/04/18   Minna Antis, MD    Physical Exam Vitals: Blood pressure 109/61, weight 299 lb (135.6 kg), last menstrual period 07/28/2019. Body mass index is 40.55 kg/m.  General: NAD, well nourished, appears stated age HEENT: normocephalic, anicteric Pulmonary: No increased work of breathing, CTAB Neurologic: Grossly intact Psychiatric: mood appropriate, affect full   Assessment: 28 y.o. G2P1001 at [redacted]w[redacted]d presenting to initiate prenatal care  Plan: 1) Avoid alcoholic beverages. 2) Patient encouraged not to smoke.  3) Discontinue the use of all non-medicinal drugs and chemicals.  4) Take prenatal vitamins daily.  5) Nutrition, food safety (fish, cheese  advisories, and high nitrite foods) and exercise discussed. 6) Hospital and practice style discussed with cross coverage system.  7) Genetic Screening, such as with 1st Trimester Screening, cell free fetal DNA, AFP testing, and Ultrasound, as well as with amniocentesis and CVS as appropriate, is discussed with patient. At the conclusion of today's visit patient requested genetic testing 8) Consider starting low dose ASA at >[redacted] weeks gestation as per USPTF recommendation "Low-Dose Aspirin Use for the Prevention of Morbidity and Mortality From Preeclampsia: Preventive Medicine"  furthermore endorsed by ACOG, WHO, and NIH based on evidence level B for the prevention of preeclampsia  In women deemed high risk  (  diabetes, renal disease, chronic hypertension, history of preeclampsia in prior gestation, autoimmune diseases, or multifetal gestations). 9) Body mass index is 40.55 kg/m. - additional folic acid 1g.  Baseline CMP, P/C ratio 10) Patient desires to deliver at [facility] - referal faculty practice 11) Return in about 1 week (around 09/16/2019) for [place] and dating scan.    Malachy Mood, MD, [facility], [facility] 09/09/2019, 11:08 AM

## 2019-09-11 LAB — COMPREHENSIVE METABOLIC PANEL
ALT: 35 IU/L — ABNORMAL HIGH (ref 0–32)
AST: 25 IU/L (ref 0–40)
Albumin/Globulin Ratio: 1.5 (ref 1.2–2.2)
Albumin: 4.4 g/dL (ref 3.9–5.0)
Alkaline Phosphatase: 104 IU/L (ref 39–117)
BUN/Creatinine Ratio: 8 — ABNORMAL LOW (ref 9–23)
BUN: 6 mg/dL (ref 6–20)
Bilirubin Total: 0.6 mg/dL (ref 0.0–1.2)
CO2: 18 mmol/L — ABNORMAL LOW (ref 20–29)
Calcium: 9.4 mg/dL (ref 8.7–10.2)
Chloride: 104 mmol/L (ref 96–106)
Creatinine, Ser: 0.72 mg/dL (ref 0.57–1.00)
GFR calc Af Amer: 133 mL/min/{1.73_m2} (ref >59)
GFR calc non Af Amer: 115 mL/min/{1.73_m2} (ref >59)
Globulin, Total: 2.9 g/dL (ref 1.5–4.5)
Glucose: 81 mg/dL (ref 65–99)
Potassium: 4.1 mmol/L (ref 3.5–5.2)
Sodium: 137 mmol/L (ref 134–144)
Total Protein: 7.3 g/dL (ref 6.0–8.5)

## 2019-09-11 LAB — URINE CULTURE

## 2019-09-11 LAB — RPR+RH+ABO+RUB AB+AB SCR+CB...
Antibody Screen: NEGATIVE
HIV Screen 4th Generation wRfx: NONREACTIVE
Hematocrit: 39.8 % (ref 34.0–46.6)
Hemoglobin: 13.1 g/dL (ref 11.1–15.9)
Hepatitis B Surface Ag: NEGATIVE
MCH: 27.6 pg (ref 26.6–33.0)
MCHC: 32.9 g/dL (ref 31.5–35.7)
MCV: 84 fL (ref 79–97)
Platelets: 247 10*3/uL (ref 150–450)
RBC: 4.75 x10E6/uL (ref 3.77–5.28)
RDW: 12.4 % (ref 11.7–15.4)
RPR Ser Ql: NONREACTIVE
Rh Factor: NEGATIVE
Rubella Antibodies, IGG: 4.34 index (ref >0.99)
Varicella zoster IgG: 1524 index (ref >165)
WBC: 11.6 10*3/uL — ABNORMAL HIGH (ref 3.4–10.8)

## 2019-09-11 LAB — HEMOGLOBIN A1C
Est. average glucose Bld gHb Est-mCnc: 105 mg/dL
Hgb A1c MFr Bld: 5.3 % (ref 4.8–5.6)

## 2019-09-18 ENCOUNTER — Encounter: Payer: Self-pay | Admitting: Obstetrics and Gynecology

## 2019-09-18 DIAGNOSIS — Z6791 Unspecified blood type, Rh negative: Secondary | ICD-10-CM | POA: Insufficient documentation

## 2019-09-18 DIAGNOSIS — O26899 Other specified pregnancy related conditions, unspecified trimester: Secondary | ICD-10-CM | POA: Insufficient documentation

## 2019-09-21 ENCOUNTER — Ambulatory Visit (INDEPENDENT_AMBULATORY_CARE_PROVIDER_SITE_OTHER): Payer: Medicaid Other | Admitting: Obstetrics and Gynecology

## 2019-09-21 ENCOUNTER — Ambulatory Visit (INDEPENDENT_AMBULATORY_CARE_PROVIDER_SITE_OTHER): Payer: Medicaid Other

## 2019-09-21 ENCOUNTER — Other Ambulatory Visit: Payer: Self-pay

## 2019-09-21 ENCOUNTER — Other Ambulatory Visit: Payer: Self-pay | Admitting: Obstetrics & Gynecology

## 2019-09-21 ENCOUNTER — Encounter: Payer: Self-pay | Admitting: Obstetrics and Gynecology

## 2019-09-21 VITALS — BP 126/82 | Wt 295.0 lb

## 2019-09-21 DIAGNOSIS — Z3689 Encounter for other specified antenatal screening: Secondary | ICD-10-CM

## 2019-09-21 DIAGNOSIS — Z3480 Encounter for supervision of other normal pregnancy, unspecified trimester: Secondary | ICD-10-CM

## 2019-09-21 DIAGNOSIS — N8311 Corpus luteum cyst of right ovary: Secondary | ICD-10-CM

## 2019-09-21 DIAGNOSIS — O219 Vomiting of pregnancy, unspecified: Secondary | ICD-10-CM

## 2019-09-21 DIAGNOSIS — Z3A01 Less than 8 weeks gestation of pregnancy: Secondary | ICD-10-CM

## 2019-09-21 DIAGNOSIS — O3481 Maternal care for other abnormalities of pelvic organs, first trimester: Secondary | ICD-10-CM | POA: Diagnosis not present

## 2019-09-21 DIAGNOSIS — Z6791 Unspecified blood type, Rh negative: Secondary | ICD-10-CM

## 2019-09-21 DIAGNOSIS — Z98891 History of uterine scar from previous surgery: Secondary | ICD-10-CM

## 2019-09-21 DIAGNOSIS — O34219 Maternal care for unspecified type scar from previous cesarean delivery: Secondary | ICD-10-CM

## 2019-09-21 DIAGNOSIS — O26891 Other specified pregnancy related conditions, first trimester: Secondary | ICD-10-CM

## 2019-09-21 DIAGNOSIS — O99210 Obesity complicating pregnancy, unspecified trimester: Secondary | ICD-10-CM

## 2019-09-21 DIAGNOSIS — O99211 Obesity complicating pregnancy, first trimester: Secondary | ICD-10-CM

## 2019-09-21 MED ORDER — BONJESTA 20-20 MG PO TBCR: 1.0000 | EXTENDED_RELEASE_TABLET | Freq: Two times a day (BID) | ORAL | 2 refills | Status: DC | PRN

## 2019-09-21 NOTE — Progress Notes (Signed)
Routine Prenatal Care Visit  Subjective  Adrienne Berry is a 28 y.o. G2P1001 at [redacted]w[redacted]d being seen today for ongoing prenatal care.  She is currently monitored for the following issues for this high-risk pregnancy and has Abnormal uterine bleeding; Pelvic pain; History of cesarean section, low transverse; Supervision of other normal pregnancy, antepartum; Maternal obesity, antepartum; Rh negative, antepartum; and Nausea and vomiting during pregnancy on their problem list.  ----------------------------------------------------------------------------------- Patient reports nausea   . Vag. Bleeding: None.   . Leaking Fluid denies.  U/S confirms SLIUP and correct gestational age ----------------------------------------------------------------------------------- The following portions of the patient's history were reviewed and updated as appropriate: allergies, current medications, past family history, past medical history, past social history, past surgical history and problem list. Problem list updated.  Objective  Blood pressure 126/82, weight 295 lb (133.8 kg), last menstrual period 07/28/2019. Pregravid weight 296 lb (134.3 kg) Total Weight Gain -1 lb (-0.454 kg) Urinalysis: Urine Protein    Urine Glucose    Fetal Status: Fetal Heart Rate (bpm): 144         General:  Alert, oriented and cooperative. Patient is in no acute distress.  Skin: Skin is warm and dry. No rash noted.   Cardiovascular: Normal heart rate noted  Respiratory: Normal respiratory effort, no problems with respiration noted  Abdomen: Soft, gravid, appropriate for gestational age.       Pelvic:  Cervical exam deferred        Extremities: Normal range of motion.     Mental Status: Normal mood and affect. Normal behavior. Normal judgment and thought content.   Imaging Results US Ob Comp Less 14 Wks  Result Date: 09/21/2019 Patient Name: Adrienne Berry DOB: 1991/09/17 MRN: [id number] ULTRASOUND REPORT Location:  [facility] Date of Service: 09/21/2019 Indications:dating Findings: Mason Jim intrauterine pregnancy is visualized with a CRL consistent with [redacted]w[redacted]d gestation, giving an (U/S) EDD of 05/05/2020. The (U/S) EDD is consistent with the clinically established EDD of 05/03/2020. FHR: 144 BPM CRL measurement: 12.6 mm Yolk sac is visualized and appears normal and early anatomy is normal. Amnion: visualized and appears normal Right Ovary is normal in appearance. Left Ovary is normal appearance. Corpus luteal cyst:  Right ovary Survey of the adnexa demonstrates no adnexal masses. There is free peritoneal fluid in the cul de sac. It measures 2.75 x 1.0 x 5.35 cm.  This is of uncertain significance. Impression: 1. [redacted]w[redacted]d Viable Singleton Intrauterine pregnancy by U/S. 2. (U/S) EDD is consistent with Clinically established EDD of 05/03/2020. Deanna Artis, RT There is a viable singleton gestation.  Detailed evaluation of the fetal anatomy is precluded by early gestational age.  It must be noted that a normal ultrasound particular at this early gestational age is unable to rule out fetal aneuploidy, risk of first trimester miscarriage, or anatomic birth defects. Thomasene Mohair, MD, [facility], [facility] 09/21/2019 8:42 AM      Assessment   28 y.o. G2P1001 at [redacted]w[redacted]d by  05/03/2020, by Last Menstrual Period presenting for routine prenatal visit  Plan   SECOND Problems (from 09/09/19 to present)    Problem Noted Resolved   Nausea and vomiting during pregnancy 09/21/2019 by Conard Novak, MD No   Rh negative, antepartum 09/18/2019 by Vena Austria, MD No   Overview Signed 09/18/2019 10:50 AM by Vena Austria, MD    [ ]  Rhogam 28 weeks      History of cesarean section, low transverse 09/09/2019 by 09/11/2019, MD No  Overview Addendum 09/09/2019 11:10 AM by Malachy Mood, MD    Active phase arrest 3020 grams, 6 pounds 11 ounces      Supervision of other normal  pregnancy, antepartum 09/09/2019 by Malachy Mood, MD No   Overview Signed 09/18/2019 10:52 AM by Malachy Mood, MD    Clinic [facility] Prenatal Labs  Dating  Blood type: O negative  Genetic Screen 1 Screen:    AFP:     Quad:     NIPS: SMA:  FragileX:  CF: Antibody: Negative  Anatomic [place]  Rubella: Immune Varicella: Immune  GTT HgbA1C 5.3 RPR: NR  Rhogam  HBsAg: Negative  TDaP vaccine                       Flu Shot: HIV: Negative  Baby Food                                GBS:   Contraception  GBS prior pregnancy:  Pelvis Tested   Pap:  CS/VBAC    Support Person           Maternal obesity, antepartum 09/09/2019 by Malachy Mood, MD No       Preterm labor symptoms and general obstetric precautions including but not limited to vaginal bleeding, contractions, leaking of fluid and fetal movement were reviewed in detail with the patient. Please refer to After Visit Summary for other counseling recommendations.   Return in about 15 days (around 10/06/2019) for genetic screen lab and routine prenatal.  Prentice Docker, MD, [place], [facility] 09/21/2019 8:59 AM

## 2019-09-29 ENCOUNTER — Emergency Department: Payer: Medicaid Other

## 2019-09-29 ENCOUNTER — Emergency Department
Admission: EM | Admit: 2019-09-29 | Discharge: 2019-09-29 | Disposition: A | Discharge status: Home / Self Care | Payer: Medicaid Other | Attending: Emergency Medicine | Admitting: Emergency Medicine

## 2019-09-29 ENCOUNTER — Encounter: Payer: Self-pay | Admitting: Emergency Medicine

## 2019-09-29 ENCOUNTER — Other Ambulatory Visit: Payer: Self-pay

## 2019-09-29 DIAGNOSIS — O210 Mild hyperemesis gravidarum: Secondary | ICD-10-CM | POA: Diagnosis not present

## 2019-09-29 DIAGNOSIS — Z87891 Personal history of nicotine dependence: Secondary | ICD-10-CM | POA: Diagnosis not present

## 2019-09-29 DIAGNOSIS — Z3A09 9 weeks gestation of pregnancy: Secondary | ICD-10-CM | POA: Diagnosis not present

## 2019-09-29 DIAGNOSIS — O219 Vomiting of pregnancy, unspecified: Secondary | ICD-10-CM | POA: Diagnosis present

## 2019-09-29 DIAGNOSIS — R7989 Other specified abnormal findings of blood chemistry: Secondary | ICD-10-CM | POA: Insufficient documentation

## 2019-09-29 DIAGNOSIS — Z79899 Other long term (current) drug therapy: Secondary | ICD-10-CM | POA: Diagnosis not present

## 2019-09-29 LAB — HCG, QUANTITATIVE, PREGNANCY: hCG, Beta Chain, Quant, S: 171166 m[IU]/mL — ABNORMAL HIGH (ref <5)

## 2019-09-29 LAB — COMPREHENSIVE METABOLIC PANEL
ALT: 199 U/L — ABNORMAL HIGH (ref 0–44)
AST: 138 U/L — ABNORMAL HIGH (ref 15–41)
Albumin: 3.8 g/dL (ref 3.5–5.0)
Alkaline Phosphatase: 134 U/L — ABNORMAL HIGH (ref 38–126)
Anion gap: 13 (ref 5–15)
BUN: 9 mg/dL (ref 6–20)
CO2: 21 mmol/L — ABNORMAL LOW (ref 22–32)
Calcium: 9.5 mg/dL (ref 8.9–10.3)
Chloride: 100 mmol/L (ref 98–111)
Creatinine, Ser: 0.67 mg/dL (ref 0.44–1.00)
GFR calc Af Amer: >60 mL/min (ref >60)
GFR calc non Af Amer: >60 mL/min (ref >60)
Glucose, Bld: 85 mg/dL (ref 70–99)
Potassium: 3.7 mmol/L (ref 3.5–5.1)
Sodium: 134 mmol/L — ABNORMAL LOW (ref 135–145)
Total Bilirubin: 2.8 mg/dL — ABNORMAL HIGH (ref 0.3–1.2)
Total Protein: 8.2 g/dL — ABNORMAL HIGH (ref 6.5–8.1)

## 2019-09-29 LAB — URINALYSIS, COMPLETE (UACMP) WITH MICROSCOPIC
Glucose, UA: NEGATIVE mg/dL
Hgb urine dipstick: NEGATIVE
Ketones, ur: 80 mg/dL — AB
Nitrite: NEGATIVE
Protein, ur: 100 mg/dL — AB
Specific Gravity, Urine: 1.03 (ref 1.005–1.030)
pH: 6 (ref 5.0–8.0)

## 2019-09-29 LAB — CBC
HCT: 40.3 % (ref 36.0–46.0)
Hemoglobin: 13.5 g/dL (ref 12.0–15.0)
MCH: 28.1 pg (ref 26.0–34.0)
MCHC: 33.5 g/dL (ref 30.0–36.0)
MCV: 84 fL (ref 80.0–100.0)
Platelets: 262 10*3/uL (ref 150–400)
RBC: 4.8 MIL/uL (ref 3.87–5.11)
RDW: 12.2 % (ref 11.5–15.5)
WBC: 12.4 10*3/uL — ABNORMAL HIGH (ref 4.0–10.5)
nRBC: 0 % (ref 0.0–0.2)

## 2019-09-29 LAB — LIPASE, BLOOD: Lipase: 21 U/L (ref 11–51)

## 2019-09-29 MED ORDER — CEPHALEXIN 500 MG PO CAPS: 500.0000 mg | ORAL_CAPSULE | Freq: Two times a day (BID) | ORAL | 0 refills | Status: AC

## 2019-09-29 MED ORDER — SODIUM CHLORIDE 0.9 % IV BOLUS
1000.0000 mL | Freq: Once | INTRAVENOUS | Status: AC
  Administered 2019-09-29: 14:00:00 1000 mL via INTRAVENOUS

## 2019-09-29 MED ORDER — ONDANSETRON 8 MG PO TBDP: 8.0000 mg | ORAL_TABLET | Freq: Three times a day (TID) | ORAL | 0 refills | Status: DC | PRN

## 2019-09-29 MED ORDER — ONDANSETRON HCL 4 MG/2ML IJ SOLN
4.0000 mg | Freq: Once | INTRAMUSCULAR | Status: AC
  Administered 2019-09-29: 14:00:00 4 mg via INTRAVENOUS
  Filled 2019-09-29: qty 2

## 2019-09-29 NOTE — Discharge Instructions (Addendum)
Take the Zofran as needed for nausea, and gradually advance your diet as you are able to tolerate. Take the antibiotic as prescribed and finish the full course.    Follow-up with Dr. Georgianne Fick next week as scheduled.  Return to the ER immediately for new or worsening vomiting, inability to hold anything down, feeling weak, new or worsening abdominal pain, jaundice (yellowing of your eyes or of your skin), dark urine, or any other new or worsening symptoms that concern you.

## 2019-09-29 NOTE — ED Notes (Signed)
Patient transported to Ultrasound 

## 2019-09-29 NOTE — ED Triage Notes (Signed)
Pt in via POV, reports hyperemesis x approximately two weeks.  Pt is approximately [redacted] weeks pregnant.  States she is unable to keep anything down, becoming dizzy, light headed today.

## 2019-09-29 NOTE — ED Provider Notes (Signed)
[facility] Emergency Department Provider Note ____________________________________________   First MD Initiated Contact with Patient 09/29/19 1254     (approximate)  I have reviewed the triage vital signs and the nursing notes.   HISTORY  Chief Complaint Hyperemesis Gravidarum    HPI Adrienne Berry is a 28 y.o. female G2P1 at [redacted] weeks pregnant who presents with nausea and vomiting for the last 2 weeks, persistent course, and worsening over the last 5 days.  The patient states that in the last 5 days she has not been able to hold anything down.  She reports some mild occasional diarrhea.  She also reports upper abdominal pain under her ribs intermittently.  She denies associated fever, urinary symptoms, or vaginal bleeding.  The patient reports a previous history of hyperemesis during her last pregnancy.  Past Medical History:  Diagnosis Date  . Anemia     Patient Active Problem List   Diagnosis Date Noted  . Nausea and vomiting during pregnancy 09/21/2019  . Rh negative, antepartum 09/18/2019  . History of cesarean section, low transverse 09/09/2019  . Supervision of other normal pregnancy, antepartum 09/09/2019  . Maternal obesity, antepartum 09/09/2019  . Abnormal uterine bleeding 08/19/2019  . Pelvic pain 08/19/2019    Past Surgical History:  Procedure Laterality Date  . CESAREAN SECTION      Prior to Admission medications   Medication Sig Start Date End Date Taking? Authorizing Provider  cephALEXin (KEFLEX) 500 MG capsule Take 1 capsule (500 mg total) by mouth 2 (two) times daily for 7 days. 09/29/19 10/06/19  Arta Silence, MD  Doxylamine-Pyridoxine ER (BONJESTA) 20-20 MG TBCR Take 1 tablet by mouth 2 (two) times daily as needed. Sig 1 tab po  daily at bedtime, 1 tab po daily morning 09/21/19 11/20/19  Will Bonnet, MD  folic acid (FOLVITE) 1 MG tablet Take 1 tablet (1 mg total) by mouth daily. 09/09/19   Malachy Mood, MD   ondansetron (ZOFRAN ODT) 8 MG disintegrating tablet Take 1 tablet (8 mg total) by mouth every 8 (eight) hours as needed for nausea or vomiting. 09/29/19   Arta Silence, MD  prenatal vitamin w/FE, FA (NATACHEW) 29-1 MG CHEW chewable tablet Chew 1 tablet by mouth daily at 12 noon. Taking Prenatal Vitamin with iron gummies.    [provider]    Allergies Patient has no known allergies.  Family History  Problem Relation Age of Onset  . Ovarian cysts Mother   . Ovarian cysts Maternal Aunt     Social History Social History   Tobacco Use  . Smoking status: Former Smoker    Packs/day: 0.50    Years: 12.00    Pack years: 6.00  . Smokeless tobacco: Never Used  Substance Use Topics  . Alcohol use: Not Currently    Frequency: Never  . Drug use: Never    Review of Systems  Constitutional: No fever. Eyes: No visual changes. ENT: No sore throat. Cardiovascular: Denies chest pain. Respiratory: Denies shortness of breath. Gastrointestinal: Positive for nausea and vomiting. Genitourinary: Negative for dysuria.  Musculoskeletal: Negative for back pain. Skin: Negative for rash. Neurological: Negative for headache.   ____________________________________________   PHYSICAL EXAM:  VITAL SIGNS: ED Triage Vitals [09/29/19 1058]  Enc Vitals Group     BP 137/83     Pulse Rate 96     Resp 16     Temp 97.7 F (36.5 C)     Temp Source Oral     SpO2 98 %  Weight 279 lb (126.6 kg)     Height      Head Circumference      Peak Flow      Pain Score 0     Pain Loc      Pain Edu?      Excl. in [place]?     Constitutional: Alert and oriented.  Relative well appearing and in no acute distress. Eyes: Conjunctivae are normal.  No scleral icterus. Head: Atraumatic. Nose: No congestion/rhinnorhea. Mouth/Throat: Mucous membranes are somewhat dry.   Neck: Normal range of motion.  Cardiovascular: Normal rate, regular rhythm. Good peripheral circulation. Respiratory: Normal  respiratory effort.  No retractions.  Gastrointestinal: Mild right upper quadrant tenderness.  No distention.  Genitourinary: No flank tenderness. Musculoskeletal: Extremities warm and well perfused.  Neurologic:  Normal speech and language. No gross focal neurologic deficits are appreciated.  Skin:  Skin is warm and dry. No rash noted. Psychiatric: Mood and affect are normal. Speech and behavior are normal.  ____________________________________________   LABS (all labs ordered are listed, but only abnormal results are displayed)  Labs Reviewed  COMPREHENSIVE METABOLIC PANEL - Abnormal; Notable for the following components:      Result Value   Sodium 134 (*)    CO2 21 (*)    Total Protein 8.2 (*)    AST 138 (*)    ALT 199 (*)    Alkaline Phosphatase 134 (*)    Total Bilirubin 2.8 (*)    All other components within normal limits  CBC - Abnormal; Notable for the following components:   WBC 12.4 (*)    All other components within normal limits  URINALYSIS, COMPLETE (UACMP) WITH MICROSCOPIC - Abnormal; Notable for the following components:   Color, Urine AMBER (*)    APPearance HAZY (*)    Bilirubin Urine SMALL (*)    Ketones, ur 80 (*)    Protein, ur 100 (*)    Leukocytes,Ua SMALL (*)    Bacteria, UA MANY (*)    All other components within normal limits  HCG, QUANTITATIVE, PREGNANCY - Abnormal; Notable for the following components:   hCG, Beta Chain, Quant, S 171,166 (*)    All other components within normal limits  LIPASE, BLOOD   ____________________________________________  EKG   ____________________________________________  RADIOLOGY  US abdomen RUQ: No gallstones or other acute abnormalities  ____________________________________________   PROCEDURES  Procedure(s) performed: No  Procedures  Critical Care performed: No ____________________________________________   INITIAL IMPRESSION / ASSESSMENT AND PLAN / ED COURSE  Pertinent labs & imaging results  that were available during my care of the patient were reviewed by me and considered in my medical decision making (see chart for details).  28 year old female G2P1 at 9 weeks today presents with nausea and vomiting over the last 2 weeks, worsening over the last several days, and associated with some upper abdominal pain.  On exam, the patient is relatively well-appearing.  Her vital signs are normal.  The abdomen is soft with mild right upper quadrant tenderness.  Overall presentation is most consistent with hyperemesis gravidarum, however lab work-up obtained from triage reveals elevated LFTs, alk phos, bilirubin, and WBC count which are all new when compared to labs from 2 weeks ago.  Therefore, differential includes cholecystitis or other acute hepatobiliary etiology.  We will give IV fluids, antiemetic, obtain right upper quadrant ultrasound and lipase, and reassess.  ----------------------------------------- 6:24 PM on 09/29/2019 -----------------------------------------  The lipase and right upper quadrant ultrasound were within normal limits.  I consulted Dr. Georgianne Fick from OB/GYN and discussed the case with him.  He advised that this elevation in the LFTs and bilirubin could be directly related to hyperemesis, but also could be reflective of an underlying issue such as Gilbert's syndrome.  He recommended that the patient follow-up as scheduled and that he would track her LFTs.  On reassessment, the patient was feeling much better after fluids and Zofran.  She was able to tolerate p.o. without difficulty, and was stable for discharge home.  I prescribed Zofran as well as Keflex due to the evidence of UTI on the urinalysis.  I discussed the results of the work-up and the plan of care with the patient, gave her thorough return precautions; she expressed understanding. ____________________________________________   FINAL CLINICAL IMPRESSION(S) / ED DIAGNOSES  Final diagnoses:  Elevated  LFTs  Hyperemesis gravidarum      NEW MEDICATIONS STARTED DURING THIS VISIT:  Discharge Medication List as of 09/29/2019  4:35 PM    START taking these medications   Details  cephALEXin (KEFLEX) 500 MG capsule Take 1 capsule (500 mg total) by mouth 2 (two) times daily for 7 days., Starting Tue 09/29/2019, Until Tue 10/06/2019, Normal    ondansetron (ZOFRAN ODT) 8 MG disintegrating tablet Take 1 tablet (8 mg total) by mouth every 8 (eight) hours as needed for nausea or vomiting., Starting Tue 09/29/2019, Normal         Note:  This document was prepared using Dragon voice recognition software and may include unintentional dictation errors.   Arta Silence, MD 09/29/19 1836

## 2019-09-29 NOTE — ED Notes (Signed)
Patient reports that she is feeling better, nausea has subsided.  Graham crackers and ginger ale were provided.

## 2019-09-29 NOTE — ED Notes (Signed)
Patient finished crackers and drink without complaint of nausea.

## 2019-10-06 ENCOUNTER — Ambulatory Visit (INDEPENDENT_AMBULATORY_CARE_PROVIDER_SITE_OTHER): Payer: Medicaid Other | Admitting: Certified Nurse Midwife

## 2019-10-06 ENCOUNTER — Other Ambulatory Visit: Payer: Self-pay

## 2019-10-06 VITALS — BP 120/80 | Wt 283.0 lb

## 2019-10-06 DIAGNOSIS — O210 Mild hyperemesis gravidarum: Secondary | ICD-10-CM

## 2019-10-06 DIAGNOSIS — Z1379 Encounter for other screening for genetic and chromosomal anomalies: Secondary | ICD-10-CM

## 2019-10-06 DIAGNOSIS — Z3A10 10 weeks gestation of pregnancy: Secondary | ICD-10-CM

## 2019-10-06 DIAGNOSIS — R7401 Elevation of levels of liver transaminase levels: Secondary | ICD-10-CM | POA: Insufficient documentation

## 2019-10-06 DIAGNOSIS — Z3480 Encounter for supervision of other normal pregnancy, unspecified trimester: Secondary | ICD-10-CM

## 2019-10-06 LAB — POCT URINALYSIS DIPSTICK OB: Glucose, UA: NEGATIVE

## 2019-10-06 MED ORDER — BONJESTA 20-20 MG PO TBCR: 1.0000 | EXTENDED_RELEASE_TABLET | Freq: Two times a day (BID) | ORAL | 2 refills | Status: AC | PRN

## 2019-10-06 NOTE — Progress Notes (Signed)
C/o n/v-dehydration, uti, liver enzymes low probably from not eating in a few days - in hosp for a day.rj

## 2019-10-07 LAB — COMPREHENSIVE METABOLIC PANEL
ALT: 114 IU/L — ABNORMAL HIGH (ref 0–32)
AST: 21 IU/L (ref 0–40)
Albumin/Globulin Ratio: 1.4 (ref 1.2–2.2)
Albumin: 3.8 g/dL — ABNORMAL LOW (ref 3.9–5.0)
Alkaline Phosphatase: 155 IU/L — ABNORMAL HIGH (ref 39–117)
BUN/Creatinine Ratio: 13 (ref 9–23)
BUN: 9 mg/dL (ref 6–20)
Bilirubin Total: 0.5 mg/dL (ref 0.0–1.2)
CO2: 22 mmol/L (ref 20–29)
Calcium: 8.8 mg/dL (ref 8.7–10.2)
Chloride: 102 mmol/L (ref 96–106)
Creatinine, Ser: 0.69 mg/dL (ref 0.57–1.00)
GFR calc Af Amer: 138 mL/min/{1.73_m2} (ref >59)
GFR calc non Af Amer: 120 mL/min/{1.73_m2} (ref >59)
Globulin, Total: 2.8 g/dL (ref 1.5–4.5)
Glucose: 86 mg/dL (ref 65–99)
Potassium: 4.3 mmol/L (ref 3.5–5.2)
Sodium: 137 mmol/L (ref 134–144)
Total Protein: 6.6 g/dL (ref 6.0–8.5)

## 2019-10-11 LAB — MATERNIT 21 PLUS CORE, BLOOD
Fetal Fraction: 7
Result (T21): NEGATIVE
Trisomy 13 (Patau syndrome): NEGATIVE
Trisomy 18 (Edwards syndrome): NEGATIVE
Trisomy 21 (Down syndrome): NEGATIVE

## 2019-10-14 ENCOUNTER — Observation Stay
Admission: EM | Admit: 2019-10-14 | Discharge: 2019-10-15 | Disposition: A | Discharge status: Home / Self Care | Payer: Medicaid Other | Attending: Family Medicine | Admitting: Family Medicine

## 2019-10-14 ENCOUNTER — Encounter: Payer: Self-pay | Admitting: Family Medicine

## 2019-10-14 ENCOUNTER — Encounter: Payer: Self-pay | Admitting: Certified Nurse Midwife

## 2019-10-14 ENCOUNTER — Encounter: Payer: Self-pay | Admitting: Medical Oncology

## 2019-10-14 ENCOUNTER — Other Ambulatory Visit: Payer: Self-pay

## 2019-10-14 DIAGNOSIS — K921 Melena: Secondary | ICD-10-CM | POA: Diagnosis not present

## 2019-10-14 DIAGNOSIS — Z842 Family history of other diseases of the genitourinary system: Secondary | ICD-10-CM | POA: Diagnosis not present

## 2019-10-14 DIAGNOSIS — O99611 Diseases of the digestive system complicating pregnancy, first trimester: Secondary | ICD-10-CM | POA: Diagnosis not present

## 2019-10-14 DIAGNOSIS — Z79899 Other long term (current) drug therapy: Secondary | ICD-10-CM | POA: Insufficient documentation

## 2019-10-14 DIAGNOSIS — Z87891 Personal history of nicotine dependence: Secondary | ICD-10-CM | POA: Insufficient documentation

## 2019-10-14 DIAGNOSIS — O99211 Obesity complicating pregnancy, first trimester: Secondary | ICD-10-CM | POA: Insufficient documentation

## 2019-10-14 DIAGNOSIS — Z3480 Encounter for supervision of other normal pregnancy, unspecified trimester: Secondary | ICD-10-CM

## 2019-10-14 DIAGNOSIS — Z87440 Personal history of urinary (tract) infections: Secondary | ICD-10-CM | POA: Diagnosis not present

## 2019-10-14 DIAGNOSIS — Z3A11 11 weeks gestation of pregnancy: Secondary | ICD-10-CM | POA: Diagnosis not present

## 2019-10-14 DIAGNOSIS — K2901 Acute gastritis with bleeding: Secondary | ICD-10-CM

## 2019-10-14 DIAGNOSIS — Z20828 Contact with and (suspected) exposure to other viral communicable diseases: Secondary | ICD-10-CM | POA: Diagnosis not present

## 2019-10-14 DIAGNOSIS — O210 Mild hyperemesis gravidarum: Secondary | ICD-10-CM | POA: Insufficient documentation

## 2019-10-14 DIAGNOSIS — O99891 Other specified diseases and conditions complicating pregnancy: Secondary | ICD-10-CM | POA: Insufficient documentation

## 2019-10-14 DIAGNOSIS — O26891 Other specified pregnancy related conditions, first trimester: Secondary | ICD-10-CM | POA: Insufficient documentation

## 2019-10-14 DIAGNOSIS — E860 Dehydration: Secondary | ICD-10-CM | POA: Diagnosis not present

## 2019-10-14 DIAGNOSIS — O26899 Other specified pregnancy related conditions, unspecified trimester: Secondary | ICD-10-CM | POA: Diagnosis present

## 2019-10-14 DIAGNOSIS — R197 Diarrhea, unspecified: Secondary | ICD-10-CM | POA: Diagnosis present

## 2019-10-14 DIAGNOSIS — O99280 Endocrine, nutritional and metabolic diseases complicating pregnancy, unspecified trimester: Secondary | ICD-10-CM

## 2019-10-14 LAB — CBC
HCT: 38.3 % (ref 36.0–46.0)
HCT: 35 % — ABNORMAL LOW (ref 36.0–46.0)
Hemoglobin: 13.1 g/dL (ref 12.0–15.0)
Hemoglobin: 11.6 g/dL — ABNORMAL LOW (ref 12.0–15.0)
MCH: 28.5 pg (ref 26.0–34.0)
MCH: 28.1 pg (ref 26.0–34.0)
MCHC: 34.2 g/dL (ref 30.0–36.0)
MCHC: 33.1 g/dL (ref 30.0–36.0)
MCV: 83.4 fL (ref 80.0–100.0)
MCV: 84.7 fL (ref 80.0–100.0)
Platelets: 230 10*3/uL (ref 150–400)
Platelets: 202 10*3/uL (ref 150–400)
RBC: 4.59 MIL/uL (ref 3.87–5.11)
RBC: 4.13 MIL/uL (ref 3.87–5.11)
RDW: 13 % (ref 11.5–15.5)
RDW: 12.9 % (ref 11.5–15.5)
WBC: 14.8 10*3/uL — ABNORMAL HIGH (ref 4.0–10.5)
WBC: 12 10*3/uL — ABNORMAL HIGH (ref 4.0–10.5)
nRBC: 0 % (ref 0.0–0.2)
nRBC: 0 % (ref 0.0–0.2)

## 2019-10-14 LAB — GASTROINTESTINAL PANEL BY PCR, STOOL (REPLACES STOOL CULTURE)

## 2019-10-14 LAB — COMPREHENSIVE METABOLIC PANEL
ALT: 36 U/L (ref 0–44)
AST: 19 U/L (ref 15–41)
Albumin: 3.3 g/dL — ABNORMAL LOW (ref 3.5–5.0)
Alkaline Phosphatase: 98 U/L (ref 38–126)
Anion gap: 14 (ref 5–15)
BUN: 11 mg/dL (ref 6–20)
CO2: 22 mmol/L (ref 22–32)
Calcium: 8.9 mg/dL (ref 8.9–10.3)
Chloride: 99 mmol/L (ref 98–111)
Creatinine, Ser: 0.61 mg/dL (ref 0.44–1.00)
GFR calc Af Amer: >60 mL/min (ref >60)
GFR calc non Af Amer: >60 mL/min (ref >60)
Glucose, Bld: 102 mg/dL — ABNORMAL HIGH (ref 70–99)
Potassium: 4 mmol/L (ref 3.5–5.1)
Sodium: 135 mmol/L (ref 135–145)
Total Bilirubin: 0.5 mg/dL (ref 0.3–1.2)
Total Protein: 7.3 g/dL (ref 6.5–8.1)

## 2019-10-14 LAB — HCG, QUANTITATIVE, PREGNANCY: hCG, Beta Chain, Quant, S: 151362 m[IU]/mL — ABNORMAL HIGH (ref <5)

## 2019-10-14 LAB — URINALYSIS, COMPLETE (UACMP) WITH MICROSCOPIC
Bacteria, UA: NONE SEEN
Bilirubin Urine: NEGATIVE
Glucose, UA: NEGATIVE mg/dL
Hgb urine dipstick: NEGATIVE
Ketones, ur: NEGATIVE mg/dL
Leukocytes,Ua: NEGATIVE
Nitrite: NEGATIVE
Protein, ur: NEGATIVE mg/dL
Specific Gravity, Urine: 1.023 (ref 1.005–1.030)
pH: 6 (ref 5.0–8.0)

## 2019-10-14 LAB — C DIFFICILE QUICK SCREEN W PCR REFLEX
C Diff antigen: NEGATIVE
C Diff interpretation: NOT DETECTED
C Diff toxin: NEGATIVE

## 2019-10-14 LAB — LIPASE, BLOOD: Lipase: 23 U/L (ref 11–51)

## 2019-10-14 LAB — SARS CORONAVIRUS 2 (TAT 6-24 HRS): SARS Coronavirus 2: NEGATIVE

## 2019-10-14 MED ORDER — LACTATED RINGERS IV BOLUS
1000.0000 mL | Freq: Once | INTRAVENOUS | Status: AC
  Administered 2019-10-14: 1000 mL via INTRAVENOUS

## 2019-10-14 MED ORDER — ONDANSETRON 4 MG PO TBDP: 4.0000 mg | ORAL_TABLET | Freq: Three times a day (TID) | ORAL | 0 refills | Status: DC | PRN

## 2019-10-14 MED ORDER — ACETAMINOPHEN 325 MG PO TABS
650.0000 mg | ORAL_TABLET | Freq: Four times a day (QID) | ORAL | Status: DC | PRN
  Administered 2019-10-14 – 2019-10-15 (×2): 650 mg via ORAL
  Filled 2019-10-14 (×2): qty 2

## 2019-10-14 MED ORDER — VITAMIN B-6 50 MG PO TABS
25.0000 mg | ORAL_TABLET | Freq: Two times a day (BID) | ORAL | Status: DC | PRN
  Filled 2019-10-14: qty 0.5

## 2019-10-14 MED ORDER — FOLIC ACID 1 MG PO TABS
1.0000 mg | ORAL_TABLET | Freq: Every day | ORAL | Status: DC
  Administered 2019-10-14: 1 mg via ORAL
  Filled 2019-10-14 (×2): qty 1

## 2019-10-14 MED ORDER — ACETAMINOPHEN 650 MG RE SUPP: 650.0000 mg | Freq: Four times a day (QID) | RECTAL | Status: DC | PRN

## 2019-10-14 MED ORDER — ONDANSETRON HCL 4 MG PO TABS: 4.0000 mg | ORAL_TABLET | Freq: Four times a day (QID) | ORAL | Status: DC | PRN

## 2019-10-14 MED ORDER — PRENATAL MULTIVITAMIN CH: 1.0000 | ORAL_TABLET | Freq: Every day | ORAL | Status: DC

## 2019-10-14 MED ORDER — FAMOTIDINE 20 MG PO TABS: 20.0000 mg | ORAL_TABLET | Freq: Every evening | ORAL | 0 refills | Status: DC

## 2019-10-14 MED ORDER — OXYCODONE HCL 5 MG PO TABS: 5.0000 mg | ORAL_TABLET | ORAL | Status: DC | PRN

## 2019-10-14 MED ORDER — ONDANSETRON HCL 4 MG/2ML IJ SOLN
4.0000 mg | Freq: Four times a day (QID) | INTRAMUSCULAR | Status: DC | PRN
  Administered 2019-10-14: 4 mg via INTRAVENOUS
  Filled 2019-10-14: qty 2

## 2019-10-14 MED ORDER — DOXYLAMINE SUCCINATE (SLEEP) 25 MG PO TABS
25.0000 mg | ORAL_TABLET | Freq: Two times a day (BID) | ORAL | Status: DC | PRN
  Filled 2019-10-14: qty 1

## 2019-10-14 MED ORDER — DOXYLAMINE-PYRIDOXINE ER 20-20 MG PO TBCR: 1.0000 | EXTENDED_RELEASE_TABLET | Freq: Two times a day (BID) | ORAL | Status: DC | PRN

## 2019-10-14 MED ORDER — DOCUSATE SODIUM 100 MG PO CAPS: 100.0000 mg | ORAL_CAPSULE | Freq: Every day | ORAL | 0 refills | Status: AC

## 2019-10-14 MED ORDER — SODIUM CHLORIDE 0.9 % IV SOLN
INTRAVENOUS | Status: DC
  Administered 2019-10-14 – 2019-10-15 (×3): via INTRAVENOUS

## 2019-10-14 MED ORDER — PROMETHAZINE HCL 25 MG/ML IJ SOLN
12.5000 mg | Freq: Once | INTRAMUSCULAR | Status: AC
  Administered 2019-10-14: 12.5 mg via INTRAVENOUS
  Filled 2019-10-14: qty 1

## 2019-10-14 NOTE — ED Triage Notes (Signed)
Pt ambultory to triage with reports that she woke up during middle of night last night with lower abd cramping and pain in lower back. States that she has vomited and had some loose stool both of which appreared to have blood in them. Pt reports she is 11 weeks preg with EDD 05/03/2020. Pt denies vaginal bleeding.

## 2019-10-14 NOTE — H&P (Signed)
History and Physical  Patient Name: Adrienne SheriffJessica Wilton     ZHY:[id number]    DOB: 1991-05-10    DOA: 10/14/2019 PCP: Patient, No Pcp Per  Patient coming from: Home  Chief Complaint: Bloody diarrhea, and abdominal cramps      HPI: Adrienne Berry is a 28 y.o. F G2P1001 at 11 weeks without other significant PMHx who presented with acute diarrhea, vomiting and bloody stools.  Patient was in usual state of health until last night, after dinner, she started to have cramps and nausea.  Then around midnight she woke up with frequent watery stools, mixed with red blood filled the toilet bowl.  This is associated with abdominal cramps/pain, and did not get better so she came to the emergency room.  No fever, confusion.    She did have UTI on 11/3 ER visit, and finished a recent course Keflex.  In the ER, LFTs and total bilirubin had resolved.  WBC 14 K, lipase normal, UA clear.  Patient had blood in her bowel movements in the ER, and so repeat CBC was obtained that showed hemoglobin dropped from 14 to 12 g/dL.  In the hospital service were asked to observe overnight.      ROS: Review of Systems  Constitutional: Negative for chills, fever and malaise/fatigue.  Gastrointestinal: Positive for abdominal pain, blood in stool, diarrhea, nausea and vomiting. Negative for melena.  All other systems reviewed and are negative.         Past Medical History:  Diagnosis Date  . Anemia     Past Surgical History:  Procedure Laterality Date  . 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  The patient walks unassisted.  Former smoker, quit 2 months ago.  No Known Allergies  Family history: family history includes Diabetes in her mother; Ovarian cysts in her maternal aunt and mother.  Prior to Admission medications   Medication Sig Start Date End Date Taking? Authorizing Provider  folic acid (FOLVITE) 1 MG tablet Take 1 tablet (1 mg total) by mouth daily. 09/09/19  Yes  Vena AustriaStaebler, Andreas, MD  prenatal vitamin w/FE, FA (NATACHEW) 29-1 MG CHEW chewable tablet Chew 1 tablet by mouth daily at 12 noon. Taking Prenatal Vitamin with iron gummies.   Yes [provider]  docusate sodium (COLACE) 100 MG capsule Take 1 capsule (100 mg total) by mouth daily for 10 days. 10/14/19 10/24/19  Shaune PollackIsaacs, Cameron, MD  Doxylamine-Pyridoxine ER (BONJESTA) 20-20 MG TBCR Take 1 tablet by mouth 2 (two) times daily as needed. Sig 1 tab po  daily at bedtime, 1 tab po daily morning 10/06/19 12/05/19  Farrel ConnersGutierrez, Colleen, CNM  famotidine (PEPCID) 20 MG tablet Take 1 tablet (20 mg total) by mouth every evening for 5 days. 10/14/19 10/19/19  Shaune PollackIsaacs, Cameron, MD  ondansetron (ZOFRAN-ODT) 4 MG disintegrating tablet Take 1 tablet (4 mg total) by mouth every 8 (eight) hours as needed for nausea or vomiting. 10/14/19   Shaune PollackIsaacs, Cameron, MD       Physical Exam: BP 134/76 (BP Location: Right Arm)   Pulse 97   Temp 98.5 F (36.9 C) (Oral)   Resp 18   Ht 6' (1.829 m)   Wt 128.4 kg   LMP 07/28/2019 (Exact Date) Comment: Also normal menses beginning 07/13/2019 x6 days.  SpO2 97%   BMI 38.38 kg/m  General appearance: Well-developed, adult female, alert and in mild distress from cramps, nausea.   Eyes: Anicteric, conjunctiva pink, lids and lashes normal. PERRL.  ENT: No nasal deformity, discharge, epistaxis.  Hearing normal. OP moist without lesions.   Neck: No neck masses.  Trachea midline.  No thyromegaly/tenderness. Lymph: No cervical or supraclavicular lymphadenopathy. Skin: Warm and dry.  No jaundice.  No suspicious rashes or lesions. Cardiac: RRR, nl S1-S2, no murmurs appreciated.  Capillary refill is brisk.  JVP not visible.  No LE edema.  Radial pulses 2+ and symmetric. Respiratory: Normal respiratory rate and rhythm.  CTAB without rales or wheezes. Abdomen: Abdomen soft.  Moderate nonfocal TTP with guarding. No ascites, distension, hepatosplenomegaly.   MSK: No deformities or  effusions of the large joints of the upper or lower extremities bilaterally.  No cyanosis or clubbing. Neuro: Cranial nerves normal.  Sensation intact to light touch. Speech is fluent.  Muscle strength normal.    Psych: Sensorium intact and responding to questions, attention normal.  Behavior appropriate.  Affect normal.  Judgment and insight appear normal.     Labs on Admission:  I have personally reviewed following labs and imaging studies: CBC: Recent Labs  Lab 10/14/19 0744 10/14/19 1153  WBC 14.8* 12.0*  HGB 13.1 11.6*  HCT 38.3 35.0*  MCV 83.4 84.7  PLT 230 191   Basic Metabolic Panel: Recent Labs  Lab 10/14/19 0744  NA 135  K 4.0  CL 99  CO2 22  GLUCOSE 102*  BUN 11  CREATININE 0.61  CALCIUM 8.9   GFR: Estimated Creatinine Clearance: 158.7 mL/min (by C-G formula based on SCr of 0.61 mg/dL).  Liver Function Tests: Recent Labs  Lab 10/14/19 0744  AST 19  ALT 36  ALKPHOS 98  BILITOT 0.5  PROT 7.3  ALBUMIN 3.3*   Recent Labs  Lab 10/14/19 0744  LIPASE 23   No results for input(s): AMMONIA in the last 168 hours. Coagulation Profile: No results for input(s): INR, PROTIME in the last 168 hours. Cardiac Enzymes: No results for input(s): CKTOTAL, CKMB, CKMBINDEX, TROPONINI in the last 168 hours. BNP (last 3 results) No results for input(s): PROBNP in the last 8760 hours. HbA1C: No results for input(s): HGBA1C in the last 72 hours. CBG: No results for input(s): GLUCAP in the last 168 hours. Lipid Profile: No results for input(s): CHOL, HDL, LDLCALC, TRIG, CHOLHDL, LDLDIRECT in the last 72 hours. Thyroid Function Tests: No results for input(s): TSH, T4TOTAL, FREET4, T3FREE, THYROIDAB in the last 72 hours. Anemia Panel: No results for input(s): VITAMINB12, FOLATE, FERRITIN, TIBC, IRON, RETICCTPCT in the last 72 hours. Sepsis Labs:   Recent Results (from the past 240 hour(s))  C difficile quick scan w PCR reflex     Status: None   Collection Time:  10/14/19 11:53 AM   Specimen: STOOL  Result Value Ref Range Status   C Diff antigen NEGATIVE NEGATIVE Final   C Diff toxin NEGATIVE NEGATIVE Final   C Diff interpretation No C. difficile detected.  Final    Comment: Performed at [facility], [address] [place], [place] [address]                 Assessment/Plan  Blood in stool Differential includes infectious colitis (ate Japanese noodles before getting sick) or bleeding hemorrhoid in pregnancy.  Less likely IBD.  Cdiff ruled out.  Cancer unlikely.  -Will give IV fluids -Bowel rest overnight -Antiemetics as needed -Repeat CBC in AM -Follow GI PCR panel -Consult to GI in AM     Supervision of normal pregnancy at 11 weeks  -Continue folate, prenatals  DVT prophylaxis: SCDs  Code Status: FULL  Family Communication:   Disposition Plan: Anticipate IV fluids, bowel rest, follow G panel and observe overnight Consults called: OB Admission status: OBS   At the point of initial evaluation, it is my clinical opinion that admission for OBSERVATION is reasonable and necessary because the patient's presenting complaints in the context of their chronic conditions represent sufficient risk of deterioration or significant morbidity to constitute reasonable grounds for close observation in the hospital setting, but that the patient may be medically stable for discharge from the hospital within 24 to 48 hours.    Medical decision making: Patient seen at 5:29 PM on 10/14/2019.  The patient was discussed with Dr. Elissa Lovett partner.  What exists of the patient's chart was reviewed in depth and summarized above.  Clinical condition: stable.        Earl Lites Mehran Guderian [facility] Please page though AMION or Epic secure chat:  For password, contact charge nurse

## 2019-10-14 NOTE — ED Provider Notes (Addendum)
Adrienne Berry  ____________________________________________   First MD Initiated Contact with Patient 10/14/19 [phone]     (approximate)  I have reviewed the triage vital signs and the nursing notes.   HISTORY  Chief Complaint Abdominal Pain, Emesis During Pregnancy, and Diarrhea    HPI Adrienne Berry is a 28 y.o. female  Here with nausea, vomiting, diarrhea. Pt is currently [redacted] weeks pregnant with second baby. Woke up in the middle of the night with nausea, vomiting, and diarrhea. Reports both emesis and diarrhea had BRBPR. In the hematemesis it was more so phlegm tinged with blood, then the BM was actually a small amount of bright red in the toilet. Stool was not black. Reports pain in her bilateral flanks and lower abdominal area. No vaginal bleeding. Treated for UTi 2 weeks ago but still having suprapubic pain and burning/pressure with urination.        Past Medical History:  Diagnosis Date  . Anemia     Patient Active Problem List   Diagnosis Date Noted  . Elevated liver transaminase level 10/06/2019  . Hyperemesis affecting pregnancy, antepartum 10/06/2019  . Nausea and vomiting during pregnancy 09/21/2019  . Rh negative, antepartum 09/18/2019  . History of cesarean section, low transverse 09/09/2019  . Supervision of other normal pregnancy, antepartum 09/09/2019  . Maternal obesity, antepartum 09/09/2019  . Abnormal uterine bleeding 08/19/2019  . Pelvic pain 08/19/2019    Past Surgical History:  Procedure Laterality Date  . CESAREAN SECTION      Prior to Admission medications   Medication Sig Start Date End Date Taking? Authorizing Provider  folic acid (FOLVITE) 1 MG tablet Take 1 tablet (1 mg total) by mouth daily. 09/09/19  Yes Malachy Mood, MD  prenatal vitamin w/FE, FA (NATACHEW) 29-1 MG CHEW chewable tablet Chew 1 tablet by mouth daily at 12 noon. Taking Prenatal Vitamin with iron gummies.    Yes [provider]  docusate sodium (COLACE) 100 MG capsule Take 1 capsule (100 mg total) by mouth daily for 10 days. 10/14/19 10/24/19  Duffy Bruce, MD  Doxylamine-Pyridoxine ER (BONJESTA) 20-20 MG TBCR Take 1 tablet by mouth 2 (two) times daily as needed. Sig 1 tab po  daily at bedtime, 1 tab po daily morning 10/06/19 12/05/19  Dalia Heading, CNM  famotidine (PEPCID) 20 MG tablet Take 1 tablet (20 mg total) by mouth every evening for 5 days. 10/14/19 10/19/19  Duffy Bruce, MD  ondansetron (ZOFRAN-ODT) 4 MG disintegrating tablet Take 1 tablet (4 mg total) by mouth every 8 (eight) hours as needed for nausea or vomiting. 10/14/19   Duffy Bruce, MD    Allergies Patient has no known allergies.  Family History  Problem Relation Age of Onset  . Ovarian cysts Mother   . Ovarian cysts Maternal Aunt     Social History Social History   Tobacco Use  . Smoking status: Former Smoker    Packs/day: 0.50    Years: 12.00    Pack years: 6.00  . Smokeless tobacco: Never Used  Substance Use Topics  . Alcohol use: Not Currently    Frequency: Never  . Drug use: Never    Review of Systems  Review of Systems  Constitutional: Positive for fatigue. Negative for fever.  HENT: Negative for congestion and sore throat.   Eyes: Negative for visual disturbance.  Respiratory: Negative for cough and shortness of breath.   Cardiovascular: Negative for chest pain.  Gastrointestinal: Positive for abdominal pain, diarrhea, nausea  and vomiting.  Genitourinary: Negative for flank pain.  Musculoskeletal: Negative for back pain and neck pain.  Skin: Negative for rash and wound.  Neurological: Positive for weakness.     ____________________________________________  PHYSICAL EXAM:      VITAL SIGNS: ED Triage Vitals [10/14/19 0744]  Enc Vitals Group     BP 134/76     Pulse Rate 97     Resp 18     Temp 98.5 F (36.9 C)     Temp Source Oral     SpO2 97 %     Weight 283 lb (128.4  kg)     Height 6' (1.829 m)     Head Circumference      Peak Flow      Pain Score 8     Pain Loc      Pain Edu?      Excl. in GC?      Physical Exam Vitals signs and nursing Berry reviewed.  Constitutional:      General: She is not in acute distress.    Appearance: She is well-developed.  HENT:     Head: Normocephalic and atraumatic.  Eyes:     Conjunctiva/sclera: Conjunctivae normal.  Neck:     Musculoskeletal: Neck supple.  Cardiovascular:     Rate and Rhythm: Normal rate and regular rhythm.     Heart sounds: Normal heart sounds. No murmur. No friction rub.  Pulmonary:     Effort: Pulmonary effort is normal. No respiratory distress.     Breath sounds: Normal breath sounds. No wheezing or rales.  Abdominal:     General: Abdomen is flat. There is no distension.     Palpations: Abdomen is soft.     Tenderness: There is no abdominal tenderness.     Comments: No RLQ or R sided TTP, neg Murphy's, no LLQ TTP, no rebound or guarding  Genitourinary:    Comments: Nonthrombosed, slightly inflamed external hemorrhoid, no bright red blood Skin:    General: Skin is warm.     Capillary Refill: Capillary refill takes less than 2 seconds.  Neurological:     Mental Status: She is alert and oriented to person, place, and time.     Motor: No abnormal muscle tone.       ____________________________________________   LABS (all labs ordered are listed, but only abnormal results are displayed)  Labs Reviewed  COMPREHENSIVE METABOLIC PANEL - Abnormal; Notable for the following components:      Result Value   Glucose, Bld 102 (*)    Albumin 3.3 (*)    All other components within normal limits  CBC - Abnormal; Notable for the following components:   WBC 14.8 (*)    All other components within normal limits  URINALYSIS, COMPLETE (UACMP) WITH MICROSCOPIC - Abnormal; Notable for the following components:   Color, Urine YELLOW (*)    APPearance CLEAR (*)    All other components within  normal limits  HCG, QUANTITATIVE, PREGNANCY - Abnormal; Notable for the following components:   hCG, Beta Chain, Quant, S 151,362 (*)    All other components within normal limits  C DIFFICILE QUICK SCREEN W PCR REFLEX  GASTROINTESTINAL PANEL BY PCR, STOOL (REPLACES STOOL CULTURE)  SARS CORONAVIRUS 2 (TAT 6-24 HRS)  LIPASE, BLOOD  POC URINE PREG, ED    ____________________________________________  EKG: None ________________________________________  RADIOLOGY All imaging, including plain films, CT scans, and ultrasounds, independently reviewed by me, and interpretations confirmed via formal radiology reads.  ED MD interpretation:   None  Official radiology report(s): No results found.  ____________________________________________  PROCEDURES   Procedure(s) performed (including Critical Care):  Procedures  ____________________________________________  INITIAL IMPRESSION / MDM / ASSESSMENT AND PLAN / ED COURSE  As part of my medical decision making, I reviewed the following data within the electronic MEDICAL RECORD NUMBER Nursing notes reviewed and incorporated, Old chart reviewed, Notes from prior ED visits, and [place] Controlled Substance Database       *Adrienne Berry was evaluated in Emergency Department on 10/14/2019 for the symptoms described in the history of presentKathyrn Sheriff illness. She was evaluated in the context of the global COVID-19 pandemic, which necessitated consideration that the patient might be at risk for infection with the SARS-CoV-2 virus that causes COVID-19. Institutional protocols and algorithms that pertain to the evaluation of patients at risk for COVID-19 are in a state of rapid change based on information released by regulatory bodies including the CDC and federal and state organizations. These policies and algorithms were followed during the patient's care in the ED.  Some ED evaluations and interventions may be delayed as a result of limited staffing during  the pandemic.*  Clinical Course as of Oct 13 1432  Wed Oct 14, 2019  0952 WBC(!): 14.8 [JE]    Clinical Course User Index [JE] Ladora Danielhrisman, Jessie A, Student-PA    Medical Decision Making:  28 yo F here with n/v/d and blood-tinged emesis, bloody streaks in stool. Hgb is stable here. She is afebrile. Abdomen with minimal diffuse TTP, no focal RLQ TTP, guarding, or signs of appendicitis clinically. No RUQ TTP. Recent RUQ U/S neg. Labs show mild leukocytosis likely reactive, but otherwise are reassuring. UA without UTI. Lipase normal. Hemoccult is positive though hemorrhoids noted. Will discuss with OB.  D/w pt's OB Gutierrez. Given reassuring labs, exam, vitals, discussed low suspicion for obstruction, appendicitis, cholecystitis with pt. Offered XR but pt would like to avoid radiation at this time, which I feel is reasonable. She is tolerating PO. Will d/c with antiemetics (understands risks of Zofran, short course refilled), stool softener, outpt f/u in 2 days. F/u was arranged by me.   ADDENDUM: Prior to leaving, pt had large bloody BM. Hgb repeated and is downtrending to 11.6. Given + abd pain, ongoing bleeding with [redacted] wk GA, will admit for possible GI eval. D[facility]ed with Dr[facility] Almon [facility]Jackson [facility], [facility]. ____________________________________________  FINAL CLINICAL IMPRESSION(S) / ED DIAGNOSES  Final diagnoses:  Dehydration during pregnancy  Blood in stool  Acute superficial gastritis with hemorrhage     MEDICATIONS GIVEN DURING THIS VISIT:  Medications  lactated ringers bolus 1,000 mL (1,000 mLs Intravenous New Bag/Given 10/14/19 1029)  promethazine (PHENERGAN) injection 12.5 mg (12.5 mg Intravenous Given 10/14/19 1105)     ED Discharge Orders         Ordered    docusate sodium (COLACE) 100 MG capsule  Daily     10/14/19 1307    ondansetron (ZOFRAN-ODT) 4 MG disintegrating tablet  Every 8 hours PRN     10/14/19 1307    famotidine  (PEPCID) 20 MG tablet  Every evening     10/14/19 1307           Berry:  This document was prepared using Dragon voice recognition software and may include unintentional dictation errors.   Shaune PollackIsaacs, Kc Sedlak, MD 10/14/19 1433    Shaune PollackIsaacs, Brinnley Lacap, MD 10/14/19 [phone]

## 2019-10-14 NOTE — Discharge Instructions (Signed)
Take the DICLEGIS prescribed by your OBGYN first, to help with nausea. This is the safest medicine during pregnancy. Take the Zofran only as needed if this does not help.  For your constipation, try the colace/docusate stool softener, without laxative/stimulants. This can help soften stool. You can start this now.  I'd recommend over-the-counter Tucks pads or other hemorrhoid treatment for the bleeding from your rectum.  Follow-up with your OB.

## 2019-10-14 NOTE — ED Notes (Signed)
Pt given ginger ale and crackers for PO challenge per MD request

## 2019-10-14 NOTE — ED Notes (Signed)
Pt attempting to give stool sample at this time.

## 2019-10-14 NOTE — Progress Notes (Signed)
ROB at [redacted] weeks gestation. Has had hyperemesis and has lost 12# since her last visit. Seen in ER last week and given IV hydration, Zofran and Keflex for a possible UTI. Had elevated LFTs when seen in ER: AST 138 and ALT 197, bili 2.8-and was thought to be due to dehydration. Would like RX for Diclegis called in. Taking Zofran every 8 hours. Keeping down most of what she is eating/ drinking  Exam: alert, awake, in NAD Unable to hear FHTs with DT, FCA on [place] was 168  A: IUP at [redacted] weeks gestation Hyperemesis with liver enzyme elevation Desires genetic testing  P: MaterniT 21  CMP ROB in 2 weeks  Dalia Heading, CNM

## 2019-10-15 DIAGNOSIS — K921 Melena: Secondary | ICD-10-CM | POA: Diagnosis not present

## 2019-10-15 DIAGNOSIS — Z3480 Encounter for supervision of other normal pregnancy, unspecified trimester: Secondary | ICD-10-CM | POA: Diagnosis not present

## 2019-10-15 LAB — BASIC METABOLIC PANEL
Anion gap: 7 (ref 5–15)
BUN: 5 mg/dL — ABNORMAL LOW (ref 6–20)
CO2: 23 mmol/L (ref 22–32)
Calcium: 8.3 mg/dL — ABNORMAL LOW (ref 8.9–10.3)
Chloride: 105 mmol/L (ref 98–111)
Creatinine, Ser: 0.55 mg/dL (ref 0.44–1.00)
GFR calc Af Amer: >60 mL/min (ref >60)
GFR calc non Af Amer: >60 mL/min (ref >60)
Glucose, Bld: 78 mg/dL (ref 70–99)
Potassium: 3.8 mmol/L (ref 3.5–5.1)
Sodium: 135 mmol/L (ref 135–145)

## 2019-10-15 LAB — CBC
HCT: 32.4 % — ABNORMAL LOW (ref 36.0–46.0)
Hemoglobin: 11 g/dL — ABNORMAL LOW (ref 12.0–15.0)
MCH: 28.6 pg (ref 26.0–34.0)
MCHC: 34 g/dL (ref 30.0–36.0)
MCV: 84.2 fL (ref 80.0–100.0)
Platelets: 185 10*3/uL (ref 150–400)
RBC: 3.85 MIL/uL — ABNORMAL LOW (ref 3.87–5.11)
RDW: 12.9 % (ref 11.5–15.5)
WBC: 8.9 10*3/uL (ref 4.0–10.5)
nRBC: 0 % (ref 0.0–0.2)

## 2019-10-15 LAB — HIV ANTIBODY (ROUTINE TESTING W REFLEX): HIV Screen 4th Generation wRfx: NONREACTIVE

## 2019-10-15 NOTE — Progress Notes (Signed)
Patient discharged home. Discharge instructions, prescriptions and follow up appointment given to and reviewed with patient. Patient verbalized understanding. patient wheeled out by [employer]

## 2019-10-15 NOTE — Progress Notes (Signed)
Pt arrived via wheelchair from ED. VSS, reviewed POC for the night. Pt C/O lower abdominal cramping, was given tylenol. Started IV fluids. Pt resting and has no further needs at this time. Will continue to monitor.

## 2019-10-15 NOTE — Discharge Summary (Signed)
Physician Discharge Summary  Adrienne Berry ZOX:[id number] DOB: Sep 20, 1991 DOA: 10/14/2019  PCP: Patient, No Pcp Per  Admit date: 10/14/2019 Discharge date: 10/15/2019  Admitted From: Home  Disposition:  Home   Recommendations for Outpatient Follow-up:  1. Follow up with Dr. Tobi Bastos in 1 week 2. Please follow up with OB-Gyn as scheduled   Home Health: None  Equipment/Devices: None  Discharge Condition: Fair  CODE STATUS: FULL Diet recommendation: Regular  Brief/Interim Summary: Adrienne Berry is a 28 y.o. F G2P1001 at 11 weeks without other significant PMHx who presented with acute diarrhea, vomiting and bloody stools.  Patient was in usual state of health until last night, after dinner, she started to have cramps and nausea.  Then around midnight she woke up with frequent watery stools, mixed with red blood filled the toilet bowl.  This is associated with abdominal cramps/pain, and did not get better so she came to the emergency room.  No fever, confusion.    She did have UTI on 11/3 ER visit, and finished a recent course Keflex.  In the ER, LFTs and total bilirubin had resolved.  WBC 14 K, lipase normal, UA clear.  Patient had blood in her bowel movements in the ER, and so repeat CBC was obtained that showed hemoglobin dropped from 14 to 12 g/dL.  In the hospital service were asked to observe overnight.       PRINCIPAL HOSPITAL DIAGNOSIS: Hematochezia    Discharge Diagnoses:   Hematochezia Suspect bleeding hemorrhoid (given pregnancy).  Less likely inflammatory bowel disease or infectious colitis (C. difficile panel negative, GI PCR panel negative) although these are both also possible.  Cancer also unlikely.  Had one bowel movement in the ER with a small red clot in it.  She was admitted, and given IV fluids and bowel rest overnight as well as antiemetics as needed.  She had no further bloody bowel movements overnight.  Her repeat CBC in the morning was normal,  she was able to tolerate normal diet without pain, she was comfortable for discharge home.    -Close GI follow-up was arranged.    Possible acute blood loss anemia This may have also been slight dilution given the fluid she was given in the ER.    Supervision of normal pregnancy at 11 weeks  -Continue folate, prenatals            Discharge Instructions  Discharge Instructions    Ambulatory referral to Gastroenterology   Complete by: As directed    Discharge instructions   Complete by: As directed    From Dr. Maryfrances Bunnell: You were admitted for observation due to blood in your bowel movements.   Thankfully, your lab testing ruled out Cdiff (the bad kind of diarrhea) and also things like Ecoli, salmonella, and other common but serious kinds of infectious diarrhea.  Since the diarrhea and blood in stools appears to have slowed down, and your cramps are improved, I believe this is safe to follow up early next week with a gastroenterologist to discuss possible causes and how to confirm them.  That appointment is listed below, with Dr. Tobi Bastos If you cannot make the appointment, please call to reschedule  Follow up with your OB-Gyn as they recommended  Resume your other home medicines  Return to the hospital for vomiting that prevents you from eating, fever, abdominal pain that returns worse than before, or blood in bowel movements that is persistent and worsening.   Increase activity slowly   Complete by: As  directed      Allergies as of 10/15/2019   No Known Allergies     Medication List    TAKE these medications   Bonjesta 20-20 MG Tbcr Generic drug: Doxylamine-Pyridoxine ER Take 1 tablet by mouth 2 (two) times daily as needed. Sig 1 tab po  daily at bedtime, 1 tab po daily morning   docusate sodium 100 MG capsule Commonly known as: Colace Take 1 capsule (100 mg total) by mouth daily for 10 days.   famotidine 20 MG tablet Commonly known as: PEPCID Take 1  tablet (20 mg total) by mouth every evening for 5 days.   ondansetron 4 MG disintegrating tablet Commonly known as: Zofran ODT Take 1 tablet (4 mg total) by mouth every 8 (eight) hours as needed for nausea or vomiting. What changed:   medication strength  how much to take   prenatal vitamin w/FE, FA 29-1 MG Chew chewable tablet Chew 1 tablet by mouth daily at 12 noon. Taking Prenatal Vitamin with iron gummies.      Follow-up Information    Farrel Conners, CNM In 2 days.   Specialty: Certified Nurse Midwife Why: At 1:20 PM for follow-up. Call tomorrow to confirm. Contact information: [address] [place] [address] [phone]        Wyline Mood, MD. Go to.   Specialty: Gastroenterology Why: We made a GI appoitnment on Weds Nov 25 at 9am with Dr. Tobi Bastos at [facility]. Please go to this appointment, if you are unable to make this, call to reschedule Contact information: [address] [place] [address] [phone]          No Known Allergies  Consultations:     Procedures/Studies: US Ob Comp Less 14 Wks  Result Date: 09/21/2019 Patient Name: Adrienne Berry DOB: Apr 04, 1991 MRN: [id number] ULTRASOUND REPORT Location: [facility] Date of Service: 09/21/2019 Indications:dating Findings: Mason Jim intrauterine pregnancy is visualized with a CRL consistent with [redacted]w[redacted]d gestation, giving an (U/S) EDD of 05/05/2020. The (U/S) EDD is consistent with the clinically established EDD of 05/03/2020. FHR: 144 BPM CRL measurement: 12.6 mm Yolk sac is visualized and appears normal and early anatomy is normal. Amnion: visualized and appears normal Right Ovary is normal in appearance. Left Ovary is normal appearance. Corpus luteal cyst:  Right ovary Survey of the adnexa demonstrates no adnexal masses. There is free peritoneal fluid in the cul de sac. It measures 2.75 x 1.0 x 5.35 cm.  This is of uncertain significance. Impression: 1. [redacted]w[redacted]d Viable  Singleton Intrauterine pregnancy by U/S. 2. (U/S) EDD is consistent with Clinically established EDD of 05/03/2020. Deanna Artis, RT There is a viable singleton gestation.  Detailed evaluation of the fetal anatomy is precluded by early gestational age.  It must be noted that a normal ultrasound particular at this early gestational age is unable to rule out fetal aneuploidy, risk of first trimester miscarriage, or anatomic birth defects. Thomasene Mohair, MD, [facility], [facility] 09/21/2019 8:42 AM    US Abdomen Limited Ruq  Result Date: 09/29/2019 CLINICAL DATA:  Elevated LFT EXAM: ULTRASOUND ABDOMEN LIMITED RIGHT UPPER QUADRANT COMPARISON:  None. FINDINGS: Gallbladder: No gallstones or wall thickening visualized. No sonographic Murphy sign noted by sonographer. Common bile duct: Diameter: 4 mm Liver: Slightly echogenic. No focal hepatic abnormality. Portal vein is patent on color Doppler imaging with normal direction of blood flow towards the liver. Other: None. IMPRESSION: 1. Negative for gallstones or biliary dilatation 2. Echogenic liver  consistent with steatosis. Electronically Signed   By: Jasmine PangKim  Fujinaga M.D.   On: 09/29/2019 13:42       Subjective: She feels well.  She has some mild cramping still, diffusely.  1 vomiting episode overnight, which is typical during this point of her pregnancy.  She had no fever, no more rectal bleeding, no confusion.  Discharge Exam: Vitals:   10/14/19 1915 10/15/19 0812  BP: (!) 124/51 115/68  Pulse: 64 86  Resp: 18 18  Temp: 98.1 F (36.7 C) 97.8 F (36.6 C)  SpO2: 100% 100%   Vitals:   10/14/19 0744 10/14/19 1915 10/15/19 0812  BP: 134/76 (!) 124/51 115/68  Pulse: 97 64 86  Resp: 18 18 18   Temp: 98.5 F (36.9 C) 98.1 F (36.7 C) 97.8 F (36.6 C)  TempSrc: Oral Oral Oral  SpO2: 97% 100% 100%  Weight: 128.4 kg    Height: 6' (1.829 m)      General: Pt is alert, awake, not in acute distress Cardiovascular:  RRR, nl S1-S2, no murmurs appreciated.   No LE edema.   Respiratory: Normal respiratory rate and rhythm.  CTAB without rales or wheezes. Abdominal: Abdomen soft and non-tender.  No distension or HSM.   Neuro/Psych: Strength symmetric in upper and lower extremities.  Judgment and insight appear normal.   The results of significant diagnostics from this hospitalization (including imaging, microbiology, ancillary and laboratory) are listed below for reference.     Microbiology: Recent Results (from the past 240 hour(s))  C difficile quick scan w PCR reflex     Status: None   Collection Time: 10/14/19 11:53 AM   Specimen: STOOL  Result Value Ref Range Status   C Diff antigen NEGATIVE NEGATIVE Final   C Diff toxin NEGATIVE NEGATIVE Final   C Diff interpretation No C. difficile detected.  Final    Comment: Performed at [facility], [address] [place], [place] [address]  Gastrointestinal Panel by PCR , Stool     Status: None   Collection Time: 10/14/19 11:53 AM   Specimen: STOOL  Result Value Ref Range Status   Campylobacter species NOT DETECTED NOT DETECTED Final   Plesimonas shigelloides NOT DETECTED NOT DETECTED Final   Salmonella species NOT DETECTED NOT DETECTED Final   Yersinia enterocolitica NOT DETECTED NOT DETECTED Final   Vibrio species NOT DETECTED NOT DETECTED Final   Vibrio cholerae NOT DETECTED NOT DETECTED Final   Enteroaggregative E coli (EAEC) NOT DETECTED NOT DETECTED Final   Enteropathogenic E coli (EPEC) NOT DETECTED NOT DETECTED Final   Enterotoxigenic E coli (ETEC) NOT DETECTED NOT DETECTED Final   Shiga like toxin producing E coli (STEC) NOT DETECTED NOT DETECTED Final   Shigella/Enteroinvasive E coli (EIEC) NOT DETECTED NOT DETECTED Final   Cryptosporidium NOT DETECTED NOT DETECTED Final   Cyclospora cayetanensis NOT DETECTED NOT DETECTED Final   Entamoeba histolytica NOT DETECTED NOT DETECTED Final   Giardia lamblia NOT DETECTED NOT DETECTED  Final   Adenovirus F40/41 NOT DETECTED NOT DETECTED Final   Astrovirus NOT DETECTED NOT DETECTED Final   Norovirus GI/GII NOT DETECTED NOT DETECTED Final   Rotavirus A NOT DETECTED NOT DETECTED Final   Sapovirus (I, II, IV, and V) NOT DETECTED NOT DETECTED Final    Comment: Performed at [facility], [address] [place], [place] [address]  SARS CORONAVIRUS 2 (TAT 6-24 HRS) Nasopharyngeal Nasopharyngeal Swab     Status: None   Collection Time: 10/14/19 11:53 AM  Specimen: Nasopharyngeal Swab  Result Value Ref Range Status   SARS Coronavirus 2 NEGATIVE NEGATIVE Final    Comment: (NOTE) SARS-CoV-2 target nucleic acids are NOT DETECTED. The SARS-CoV-2 RNA is generally detectable in upper and lower respiratory specimens during the acute phase of infection. Negative results do not preclude SARS-CoV-2 infection, do not rule out co-infections with other pathogens, and should not be used as the sole basis for treatment or other patient management decisions. Negative results must be combined with clinical observations, patient history, and epidemiological information. The expected result is Negative. Fact Sheet for Patients: HairSlick.no Fact Sheet for Healthcare Providers: quierodirigir.com This test is not yet approved or cleared by the [place] FDA and  has been authorized for detection and/or diagnosis of SARS-CoV-2 by FDA under an Emergency Use Authorization (EUA). This EUA will remain  in effect (meaning this test can be used) for the duration of the COVID-19 declaration under Section 56 4(b)(1) of the Act, 21 U.S.C. section 360bbb-3(b)(1), unless the authorization is terminated or revoked sooner. Performed at [facility], [address] [place], [place] [address]      Labs: BNP (last 3 results) No results for input(s): BNP in the last 8760 hours. Basic Metabolic Panel: Recent Labs  Lab  10/14/19 0744 10/15/19 0549  NA 135 135  K 4.0 3.8  CL 99 105  CO2 22 23  GLUCOSE 102* 78  BUN 11 5*  CREATININE 0.61 0.55  CALCIUM 8.9 8.3*   Liver Function Tests: Recent Labs  Lab 10/14/19 0744  AST 19  ALT 36  ALKPHOS 98  BILITOT 0.5  PROT 7.3  ALBUMIN 3.3*   Recent Labs  Lab 10/14/19 0744  LIPASE 23   No results for input(s): AMMONIA in the last 168 hours. CBC: Recent Labs  Lab 10/14/19 0744 10/14/19 1153 10/15/19 0549  WBC 14.8* 12.0* 8.9  HGB 13.1 11.6* 11.0*  HCT 38.3 35.0* 32.4*  MCV 83.4 84.7 84.2  PLT 230 202 185   Cardiac Enzymes: No results for input(s): CKTOTAL, CKMB, CKMBINDEX, TROPONINI in the last 168 hours. BNP: Invalid input(s): POCBNP CBG: No results for input(s): GLUCAP in the last 168 hours. D-Dimer No results for input(s): DDIMER in the last 72 hours. Hgb A1c No results for input(s): HGBA1C in the last 72 hours. Lipid Profile No results for input(s): CHOL, HDL, LDLCALC, TRIG, CHOLHDL, LDLDIRECT in the last 72 hours. Thyroid function studies No results for input(s): TSH, T4TOTAL, T3FREE, THYROIDAB in the last 72 hours.  Invalid input(s): FREET3 Anemia work up No results for input(s): VITAMINB12, FOLATE, FERRITIN, TIBC, IRON, RETICCTPCT in the last 72 hours. Urinalysis    Component Value Date/Time   COLORURINE YELLOW (A) 10/14/2019 0744   APPEARANCEUR CLEAR (A) 10/14/2019 0744   APPEARANCEUR Hazy 02/04/2013 1639   LABSPEC 1.023 10/14/2019 0744   LABSPEC 1.015 02/04/2013 1639   PHURINE 6.0 10/14/2019 0744   GLUCOSEU NEGATIVE 10/14/2019 0744   GLUCOSEU Negative 02/04/2013 1639   HGBUR NEGATIVE 10/14/2019 0744   BILIRUBINUR NEGATIVE 10/14/2019 0744   BILIRUBINUR Negative 02/04/2013 1639   KETONESUR NEGATIVE 10/14/2019 0744   PROTEINUR NEGATIVE 10/14/2019 0744   NITRITE NEGATIVE 10/14/2019 0744   LEUKOCYTESUR NEGATIVE 10/14/2019 0744   LEUKOCYTESUR 1+ 02/04/2013 1639   Sepsis Labs Invalid input(s): PROCALCITONIN,  WBC,   LACTICIDVEN Microbiology Recent Results (from the past 240 hour(s))  C difficile quick scan w PCR reflex     Status: None   Collection Time: 10/14/19 11:53 AM  Specimen: STOOL  Result Value Ref Range Status   C Diff antigen NEGATIVE NEGATIVE Final   C Diff toxin NEGATIVE NEGATIVE Final   C Diff interpretation No C. difficile detected.  Final    Comment: Performed at [facility], [address] [place], [place] [address]  Gastrointestinal Panel by PCR , Stool     Status: None   Collection Time: 10/14/19 11:53 AM   Specimen: STOOL  Result Value Ref Range Status   Campylobacter species NOT DETECTED NOT DETECTED Final   Plesimonas shigelloides NOT DETECTED NOT DETECTED Final   Salmonella species NOT DETECTED NOT DETECTED Final   Yersinia enterocolitica NOT DETECTED NOT DETECTED Final   Vibrio species NOT DETECTED NOT DETECTED Final   Vibrio cholerae NOT DETECTED NOT DETECTED Final   Enteroaggregative E coli (EAEC) NOT DETECTED NOT DETECTED Final   Enteropathogenic E coli (EPEC) NOT DETECTED NOT DETECTED Final   Enterotoxigenic E coli (ETEC) NOT DETECTED NOT DETECTED Final   Shiga like toxin producing E coli (STEC) NOT DETECTED NOT DETECTED Final   Shigella/Enteroinvasive E coli (EIEC) NOT DETECTED NOT DETECTED Final   Cryptosporidium NOT DETECTED NOT DETECTED Final   Cyclospora cayetanensis NOT DETECTED NOT DETECTED Final   Entamoeba histolytica NOT DETECTED NOT DETECTED Final   Giardia lamblia NOT DETECTED NOT DETECTED Final   Adenovirus F40/41 NOT DETECTED NOT DETECTED Final   Astrovirus NOT DETECTED NOT DETECTED Final   Norovirus GI/GII NOT DETECTED NOT DETECTED Final   Rotavirus A NOT DETECTED NOT DETECTED Final   Sapovirus (I, II, IV, and V) NOT DETECTED NOT DETECTED Final    Comment: Performed at [facility], [address] [place], [place] [address]  SARS CORONAVIRUS 2 (TAT 6-24 HRS) Nasopharyngeal Nasopharyngeal Swab     Status: None   Collection  Time: 10/14/19 11:53 AM   Specimen: Nasopharyngeal Swab  Result Value Ref Range Status   SARS Coronavirus 2 NEGATIVE NEGATIVE Final    Comment: (NOTE) SARS-CoV-2 target nucleic acids are NOT DETECTED. The SARS-CoV-2 RNA is generally detectable in upper and lower respiratory specimens during the acute phase of infection. Negative results do not preclude SARS-CoV-2 infection, do not rule out co-infections with other pathogens, and should not be used as the sole basis for treatment or other patient management decisions. Negative results must be combined with clinical observations, patient history, and epidemiological information. The expected result is Negative. Fact Sheet for Patients: SugarRoll.be Fact Sheet for Healthcare Providers: https://www.woods-mathews.com/ This test is not yet approved or cleared by the [place] FDA and  has been authorized for detection and/or diagnosis of SARS-CoV-2 by FDA under an Emergency Use Authorization (EUA). This EUA will remain  in effect (meaning this test can be used) for the duration of the COVID-19 declaration under Section 56 4(b)(1) of the Act, 21 U.S.C. section 360bbb-3(b)(1), unless the authorization is terminated or revoked sooner. Performed at [facility], [place] [address], [place], [place] [address]      Time coordinating discharge: 30 minutes      SIGNED:   Edwin Dada, MD  [facility] 10/15/2019, 2:22 PM

## 2019-10-20 ENCOUNTER — Encounter: Payer: Medicaid Other | Admitting: Advanced Practice Midwife

## 2019-10-21 ENCOUNTER — Ambulatory Visit: Payer: Medicaid Other | Admitting: Gastroenterology

## 2019-10-21 DIAGNOSIS — K921 Melena: Secondary | ICD-10-CM

## 2019-10-26 ENCOUNTER — Encounter: Payer: Self-pay | Admitting: Gastroenterology

## 2019-10-29 ENCOUNTER — Other Ambulatory Visit: Payer: Self-pay

## 2019-10-29 ENCOUNTER — Ambulatory Visit (INDEPENDENT_AMBULATORY_CARE_PROVIDER_SITE_OTHER): Payer: Medicaid Other | Admitting: Obstetrics and Gynecology

## 2019-10-29 ENCOUNTER — Encounter: Payer: Self-pay | Admitting: Obstetrics and Gynecology

## 2019-10-29 VITALS — BP 113/76 | HR 76 | Wt 281.0 lb

## 2019-10-29 DIAGNOSIS — O219 Vomiting of pregnancy, unspecified: Secondary | ICD-10-CM

## 2019-10-29 DIAGNOSIS — O99210 Obesity complicating pregnancy, unspecified trimester: Secondary | ICD-10-CM

## 2019-10-29 DIAGNOSIS — O360110 Maternal care for anti-D [Rh] antibodies, first trimester, not applicable or unspecified: Secondary | ICD-10-CM

## 2019-10-29 DIAGNOSIS — O0991 Supervision of high risk pregnancy, unspecified, first trimester: Secondary | ICD-10-CM

## 2019-10-29 DIAGNOSIS — O99211 Obesity complicating pregnancy, first trimester: Secondary | ICD-10-CM

## 2019-10-29 DIAGNOSIS — Z98891 History of uterine scar from previous surgery: Secondary | ICD-10-CM

## 2019-10-29 DIAGNOSIS — O0990 Supervision of high risk pregnancy, unspecified, unspecified trimester: Secondary | ICD-10-CM

## 2019-10-29 DIAGNOSIS — Z6791 Unspecified blood type, Rh negative: Secondary | ICD-10-CM

## 2019-10-29 DIAGNOSIS — Z3A13 13 weeks gestation of pregnancy: Secondary | ICD-10-CM

## 2019-10-29 DIAGNOSIS — Z3480 Encounter for supervision of other normal pregnancy, unspecified trimester: Secondary | ICD-10-CM

## 2019-10-29 DIAGNOSIS — O26899 Other specified pregnancy related conditions, unspecified trimester: Secondary | ICD-10-CM

## 2019-10-29 LAB — CYTOLOGY - PAP: Diagnosis: NEGATIVE

## 2019-10-29 MED ORDER — BLOOD PRESSURE MONITOR KIT: PACK | 0 refills | Status: DC

## 2019-10-29 NOTE — Progress Notes (Signed)
Prenatal Visit Note Date: 10/29/2019 Clinic: [facility]  Transfer of Care Visit from [place] (Desires to deliver at [facility])  Subjective:  Adrienne Berry is a 28 y.o. G2P1001 at [redacted]w[redacted]d being seen today for ongoing prenatal care.  She is currently monitored for the following issues for this low-risk pregnancy and has Pelvic pain; History of cesarean section, low transverse; Supervision of other normal pregnancy, antepartum; Maternal obesity, antepartum; Rh negative, antepartum; Nausea and vomiting during pregnancy; Hyperemesis affecting pregnancy, antepartum; and Hematochezia on their problem list.  Patient reports no complaints.   Contractions: Not present. Vag. Bleeding: None.   . Denies leaking of fluid.   The following portions of the patient's history were reviewed and updated as appropriate: allergies, current medications, past family history, past medical history, past social history, past surgical history and problem list. Problem list updated.  Objective:   Vitals:   10/29/19 0957  BP: 113/76  Pulse: 76  Weight: 281 lb (127.5 kg)    Fetal Status: Fetal Heart Rate (bpm): 158         General:  Alert, oriented and cooperative. Patient is in no acute distress.  Skin: Skin is warm and dry. No rash noted.   Cardiovascular: Normal heart rate noted  Respiratory: Normal respiratory effort, no problems with respiration noted  Abdomen: Soft, gravid, appropriate for gestational age. Pain/Pressure: Absent     Pelvic:  Cervical exam deferred        Extremities: Normal range of motion.  Edema: None  Mental Status: Normal mood and affect. Normal behavior. Normal judgment and thought content.   Urinalysis:      Assessment and Plan:  Pregnancy: G2P1001 at [redacted]w[redacted]d  1. Supervision of other normal pregnancy, antepartum Routine care. Offer afp nv. Will try and see if we get pap results from 07/2019. Will set up for anatomyu/s - Enroll patient  in Babyscripts Program - [place] MFM OB COMP + 14 WK; Future  2. Supervision of high risk pregnancy, antepartum - [place] MFM OB DETAIL +14 WK; Future  3. History of cesarean section, low transverse Will d/w her more later in pregnancy re: this  4. Nausea and vomiting during pregnancy Improving. Stopped zofran a week ago. Continue to follow. Weight stable. Goals d/w her  5. Rh negative, antepartum Ab screen and rhogam 28wks.   6. Maternal obesity, antepartum  Preterm labor symptoms and general obstetric precautions including but not limited to vaginal bleeding, contractions, leaking of fluid and fetal movement were reviewed in detail with the patient. Please refer to After Visit Summary for other counseling recommendations.  Return in about 3 weeks (around 11/19/2019) for in person.   Aletha Halim, MD

## 2019-10-29 NOTE — Progress Notes (Signed)
babyscripts  Blood pressure kit  Flu injection 08/2019

## 2019-10-29 NOTE — Progress Notes (Signed)
Last pap 08/19/2019-

## 2019-10-30 ENCOUNTER — Encounter: Payer: Medicaid Other | Admitting: Certified Nurse Midwife

## 2019-11-18 ENCOUNTER — Encounter: Payer: Self-pay | Admitting: Certified Nurse Midwife

## 2019-11-18 ENCOUNTER — Ambulatory Visit (INDEPENDENT_AMBULATORY_CARE_PROVIDER_SITE_OTHER): Payer: Medicaid Other | Admitting: Certified Nurse Midwife

## 2019-11-18 ENCOUNTER — Other Ambulatory Visit: Payer: Self-pay

## 2019-11-18 VITALS — BP 121/80 | HR 90 | Wt 270.0 lb

## 2019-11-18 DIAGNOSIS — Z3A16 16 weeks gestation of pregnancy: Secondary | ICD-10-CM

## 2019-11-18 DIAGNOSIS — O99212 Obesity complicating pregnancy, second trimester: Secondary | ICD-10-CM

## 2019-11-18 DIAGNOSIS — O360920 Maternal care for other rhesus isoimmunization, second trimester, not applicable or unspecified: Secondary | ICD-10-CM

## 2019-11-18 DIAGNOSIS — Z6791 Unspecified blood type, Rh negative: Secondary | ICD-10-CM

## 2019-11-18 DIAGNOSIS — Z98891 History of uterine scar from previous surgery: Secondary | ICD-10-CM

## 2019-11-18 DIAGNOSIS — O99210 Obesity complicating pregnancy, unspecified trimester: Secondary | ICD-10-CM

## 2019-11-18 DIAGNOSIS — Z3480 Encounter for supervision of other normal pregnancy, unspecified trimester: Secondary | ICD-10-CM

## 2019-11-18 DIAGNOSIS — O26899 Other specified pregnancy related conditions, unspecified trimester: Secondary | ICD-10-CM

## 2019-11-18 MED ORDER — ASPIRIN EC 81 MG PO TBEC: 81.0000 mg | DELAYED_RELEASE_TABLET | Freq: Every day | ORAL | 0 refills | Status: DC

## 2019-11-18 NOTE — Progress Notes (Signed)
   PRENATAL VISIT NOTE  Subjective:  Adrienne Berry is a 28 y.o. G2P1001 at [redacted]w[redacted]d being seen today for ongoing prenatal care.  She is currently monitored for the following issues for this low-risk pregnancy and has Pelvic pain; History of cesarean section, low transverse; Supervision of other normal pregnancy, antepartum; Maternal obesity, antepartum; Rh negative, antepartum; Nausea and vomiting during pregnancy; and Hematochezia on their problem list.  Patient reports no complaints.  Contractions: Not present. Vag. Bleeding: None.  Movement: Absent. Denies leaking of fluid.   The following portions of the patient's history were reviewed and updated as appropriate: allergies, current medications, past family history, past medical history, past social history, past surgical history and problem list.   Objective:   Vitals:   11/18/19 0913  BP: 121/80  Pulse: 90  Weight: 270 lb (122.5 kg)    Fetal Status: Fetal Heart Rate (bpm): 155   Movement: Absent     General:  Alert, oriented and cooperative. Patient is in no acute distress.  Skin: Skin is warm and dry. No rash noted.   Cardiovascular: Normal heart rate noted  Respiratory: Normal respiratory effort, no problems with respiration noted  Abdomen: Soft, gravid, appropriate for gestational age.  Pain/Pressure: Absent     Pelvic: Cervical exam deferred        Extremities: Normal range of motion.  Edema: None  Mental Status: Normal mood and affect. Normal behavior. Normal judgment and thought content.   Assessment and Plan:  Pregnancy: G2P1001 at [redacted]w[redacted]d 1. Supervision of other normal pregnancy, antepartum - Patient doing well, no complaints - Anticipatory guidance on upcoming appointment  - Routine prenatal care   2. Rh negative, antepartum - Rhogam at 28 weeks   3. History of cesarean section, low transverse - Hx of C/S with 1st pregnancy d/t failure to progress  - Patient desires TOLAC and does not want repeat C/S  -  Educated and discussed TOLAC with patient - AVS with additional information given   4. Maternal obesity, antepartum - bASA ordered and initiated today   Preterm labor symptoms and general obstetric precautions including but not limited to vaginal bleeding, contractions, leaking of fluid and fetal movement were reviewed in detail with the patient. Please refer to After Visit Summary for other counseling recommendations.   Return in about 4 weeks (around 12/16/2019) for ROB-mychart.  Future Appointments  Date Time Provider [place]  12/08/2019  9:30 AM [facility] [place] 5 WH-MFCUS M[facility]-US  12/16/2019 10:00 AM Darlina Rumpf, CNM [facility]    Lajean Manes, [place]

## 2019-11-18 NOTE — Patient Instructions (Addendum)

## 2019-12-08 ENCOUNTER — Ambulatory Visit (HOSPITAL_COMMUNITY)
Admission: RE | Admit: 2019-12-08 | Discharge: 2019-12-08 | Disposition: A | Discharge status: Home / Self Care | Payer: Medicaid Other | Source: Ambulatory Visit | Attending: Obstetrics and Gynecology | Admitting: Obstetrics and Gynecology

## 2019-12-08 ENCOUNTER — Other Ambulatory Visit (HOSPITAL_COMMUNITY): Payer: Self-pay | Admitting: *Deleted

## 2019-12-08 ENCOUNTER — Other Ambulatory Visit: Payer: Self-pay | Admitting: Obstetrics and Gynecology

## 2019-12-08 ENCOUNTER — Other Ambulatory Visit: Payer: Self-pay

## 2019-12-08 DIAGNOSIS — O99212 Obesity complicating pregnancy, second trimester: Secondary | ICD-10-CM | POA: Diagnosis not present

## 2019-12-08 DIAGNOSIS — Z3A19 19 weeks gestation of pregnancy: Secondary | ICD-10-CM | POA: Diagnosis not present

## 2019-12-08 DIAGNOSIS — Z3480 Encounter for supervision of other normal pregnancy, unspecified trimester: Secondary | ICD-10-CM

## 2019-12-08 DIAGNOSIS — O34219 Maternal care for unspecified type scar from previous cesarean delivery: Secondary | ICD-10-CM

## 2019-12-08 DIAGNOSIS — O360190 Maternal care for anti-D [Rh] antibodies, unspecified trimester, not applicable or unspecified: Secondary | ICD-10-CM | POA: Diagnosis not present

## 2019-12-08 DIAGNOSIS — Z6841 Body Mass Index (BMI) 40.0 and over, adult: Secondary | ICD-10-CM

## 2019-12-16 ENCOUNTER — Other Ambulatory Visit: Payer: Self-pay

## 2019-12-16 ENCOUNTER — Telehealth (INDEPENDENT_AMBULATORY_CARE_PROVIDER_SITE_OTHER): Payer: Medicaid Other | Admitting: Advanced Practice Midwife

## 2019-12-16 DIAGNOSIS — Z6791 Unspecified blood type, Rh negative: Secondary | ICD-10-CM

## 2019-12-16 DIAGNOSIS — Z3A20 20 weeks gestation of pregnancy: Secondary | ICD-10-CM

## 2019-12-16 DIAGNOSIS — Z3480 Encounter for supervision of other normal pregnancy, unspecified trimester: Secondary | ICD-10-CM

## 2019-12-16 DIAGNOSIS — O99210 Obesity complicating pregnancy, unspecified trimester: Secondary | ICD-10-CM

## 2019-12-16 DIAGNOSIS — Z98891 History of uterine scar from previous surgery: Secondary | ICD-10-CM

## 2019-12-16 DIAGNOSIS — O99212 Obesity complicating pregnancy, second trimester: Secondary | ICD-10-CM

## 2019-12-16 DIAGNOSIS — O26899 Other specified pregnancy related conditions, unspecified trimester: Secondary | ICD-10-CM

## 2019-12-16 DIAGNOSIS — O360120 Maternal care for anti-D [Rh] antibodies, second trimester, not applicable or unspecified: Secondary | ICD-10-CM

## 2019-12-16 NOTE — Progress Notes (Signed)
TELEHEALTH OBSTETRICS PRENATAL VIRTUAL VIDEO VISIT ENCOUNTER NOTE  [facility]   I connected with Adrienne SheriffJessica Hahne on 12/16/19 at 10:00 AM EST by MyChart Video Encounter at home and verified that I am speaking with the correct person using two identifiers.   I discussed the limitations, risks, security and privacy concerns of performing an evaluation and management service virtually and the availability of in person appointments. I also discussed with the patient that there may be a patient responsible charge related to this service. The patient expressed understanding and agreed to proceed. Subjective:  Adrienne Berry is a 29 y.o. G2P1001 at 2068w1d being seen today for ongoing prenatal care.  She is currently monitored for the following issues for this low-risk pregnancy and has Pelvic pain; History of cesarean section, low transverse; Supervision of other normal pregnancy, antepartum; Maternal obesity, antepartum; Rh negative, antepartum; Nausea and vomiting during pregnancy; and Hematochezia on their problem list.  Patient reports no complaints.  Contractions: Not present. Vag. Bleeding: None.  Movement: Present. Denies any leaking of fluid.   The following portions of the patient's history were reviewed and updated as appropriate: allergies, current medications, past family history, past medical history, past social history, past surgical history and problem list.   Objective:  There were no vitals filed for this visit.  Fetal Status:     Movement: Present     General:  Alert, oriented and cooperative. Patient is in no acute distress.  Respiratory: Normal respiratory effort, no problems with respiration noted  Mental Status: Normal mood and affect. Normal behavior. Normal judgment and thought content.  Rest of physical exam deferred due to type of encounter  Imaging: US MFM OB DETAIL +14 WK  Result Date:  12/08/2019 ----------------------------------------------------------------------  OBSTETRICS REPORT                       (Signed Final 12/08/2019 12:40 pm) ---------------------------------------------------------------------- Patient Info  ID #:       [id number]                          D.O.B.:  [birth year] (28 yrs)  Name:       Adrienne SheriffJESSICA Berry               Visit Date: 12/08/2019 09:48 am ---------------------------------------------------------------------- Performed By  Performed By:     Birdena CrandallYasemin Karatas        Ref. Address:     [address]                    [address]                                                             [address]                                                             [place], [place]  [address]  Attending:        Noralee Space MD        [facility]  Referred By:      Randall Bing MD ---------------------------------------------------------------------- Orders   #  Description                          Code         Ordered By   1  [place] MFM OB DETAIL +14 WK              76811.01     CHARLIE PICKENS  ----------------------------------------------------------------------   #  Order #                    Accession #                 Episode #   1  [id number]                  [id number]                  [id number]  ---------------------------------------------------------------------- Indications   [redacted] weeks gestation of pregnancy                Z3A.19   Obesity complicating pregnancy, second         O99.212   trimester (BMI:36)   Encounter for antenatal screening for          Z36.3   malformations   History of cesarean delivery, currently        O34.219   pregnant (low transvers)   Rh negative state in antepartum                O36.0190   Low risk (NIPS:low risk , 7% FF, female)   ---------------------------------------------------------------------- Vital Signs  Weight (lb): 270                               Height:        6'  BMI:         36.61 ---------------------------------------------------------------------- Fetal Evaluation  Num Of Fetuses:         1  Fetal Heart Rate(bpm):  144  Cardiac Activity:       Observed  Presentation:           Variable  Placenta:               Posterior  P. Cord Insertion:      Visualized, central  Amniotic Fluid  AFI FV:      Within normal limits                              Largest Pocket(cm)  4.07 ---------------------------------------------------------------------- Biometry  BPD:        42  mm     G. Age:  18w 5d         38  %    CI:        71.47   %    70 - 86                                                          FL/HC:      17.4   %    16.1 - 18.3  HC:      158.2  mm     G. Age:  18w 5d         28  %    HC/AC:      1.09        1.09 - 1.39  AC:       145   mm     G. Age:  19w 6d         73  %    FL/BPD:     65.7   %  FL:       27.6  mm     G. Age:  18w 3d         24  %    FL/AC:      19.0   %    20 - 24  CER:      18.5  mm     G. Age:  18w 2d         28  %  NFT:       2.6  mm  CM:        2.2  mm  Est. FW:     275  gm    0 lb 10 oz      53  % ---------------------------------------------------------------------- OB History  Gravidity:    2         Term:   1  Living:       1 ---------------------------------------------------------------------- Gestational Age  LMP:           19w 0d        Date:  07/28/19                 EDD:   05/03/20  U/S Today:     19w 0d                                        EDD:   05/03/20  Best:          19w 0d     Det. By:  LMP  (07/28/19)          EDD:   05/03/20 ---------------------------------------------------------------------- Anatomy  Cranium:               Appears normal         LVOT:                   Appears normal  Cavum:                 Appears normal         Aortic Arch:  Not well visualized  Ventricles:            Appears normal         Ductal Arch:            Appears normal  Choroid Plexus:        Appears normal         Diaphragm:              Appears normal  Cerebellum:            Appears normal         Stomach:                Appears normal, left                                                                        sided  Posterior Fossa:       Appears normal         Abdomen:                Appears normal  Nuchal Fold:           Appears normal         Abdominal Wall:         Appears nml (cord                                                                        insert, abd wall)  Face:                  Appears normal         Cord Vessels:           Appears normal (3                         (orbits and profile)                           vessel cord)  Lips:                  Appears normal         Kidneys:                Appear normal  Palate:                Not well visualized    Bladder:                Appears normal  Thoracic:              Appears normal         Spine:                  Appears normal  Heart:                 Appears normal         Upper Extremities:  Appears normal                         (4CH, axis, and                         situs)  RVOT:                  Appears normal         Lower Extremities:      Appears normal  Other:  Hands and feet visualized. Technically difficult due to maternal          habitus and fetal position. Female gender. LT Heel visualized. ---------------------------------------------------------------------- Cervix Uterus Adnexa  Cervix  Length:           4.75  cm.  Normal appearance by transabdominal scan.  Uterus  No abnormality visualized.  Left Ovary  Within normal limits.  Right Ovary  Within normal limits.  Cul De Sac  No free fluid seen.  Adnexa  No abnormality visualized. ---------------------------------------------------------------------- Impression  We performed fetal anatomy scan. No makers of  aneuploidies or fetal  structural defects are seen. Fetal  biometry is consistent with her previously-established dates.  Amniotic fluid is normal and good fetal activity is seen.  Patient understands the limitations of ultrasound in detecting  fetal anomalies. Placenta is posterior and there is no  evidence of previa or accreta.  On cell-free fetal DNA screening, the risks of fetal  aneuploidies are not increased.  Maternal obesity imposes limitations on the resolution of  images, and failure to detect fetal anomalies is more common  in obese pregnant women. ---------------------------------------------------------------------- Recommendations  -An appointment was made for her to return in 4 weeks for  fetal growth assessment (and to assess aortic arch if  feasible). ----------------------------------------------------------------------                  Noralee Space, MD Electronically Signed Final Report   12/08/2019 12:40 pm ----------------------------------------------------------------------   Assessment and Plan:  Pregnancy: G2P1001 at [redacted]w[redacted]d 1. Supervision of other normal pregnancy, antepartum - Continue routine care  2. History of cesarean section, low transverse - Desires TOLAC  3. Rh negative, antepartum - Rhogam 28 weeks, postpartum evaluation  4. Maternal obesity, antepartum - Patient not taking ASA 81 mg, never started and verbalizes that she does not agree with indications - Reviewed indication for PEC prophylaxis, risk factors. Encouraged patient to reconsider  Preterm labor symptoms and general obstetric precautions including but not limited to vaginal bleeding, contractions, leaking of fluid and fetal movement were reviewed in detail with the patient. I discussed the assessment and treatment plan with the patient. The patient was provided an opportunity to ask questions and all were answered. The patient agreed with the plan and demonstrated an understanding of the instructions. The patient was advised  to call back or seek an in-person office evaluation/go to MAU at [facility] for any urgent or concerning symptoms. Please refer to After Visit Summary for other counseling recommendations.   I provided 10 minutes of face-to-face time during this encounter.   Future Appointments  Date Time Provider Department Center  01/13/2020 10:00 AM Calvert Cantor, [place] [facility]  02/02/2020 10:30 AM [facility] NURSE [facility]  02/02/2020 10:30 AM [facility] [place] 5 WH-M[facility]US MFC-US    Calvert Cantor, CNM [facility] for [employer], [facility]

## 2019-12-16 NOTE — Progress Notes (Signed)
I connected with  Adrienne Berry on 12/16/19 at 10:00 AM EST by telephone and verified that I am speaking with the correct person using two identifiers.   I discussed the limitations, risks, security and privacy concerns of performing an evaluation and management service by telephone and the availability of in person appointments. I also discussed with the patient that there may be a patient responsible charge related to this service. The patient expressed understanding and agreed to proceed.  [name], RN 12/16/2019  10:06 AM

## 2020-01-13 ENCOUNTER — Telehealth (INDEPENDENT_AMBULATORY_CARE_PROVIDER_SITE_OTHER): Payer: Medicaid Other | Admitting: Advanced Practice Midwife

## 2020-01-13 ENCOUNTER — Other Ambulatory Visit: Payer: Self-pay

## 2020-01-13 VITALS — BP 118/79 | HR 79 | Wt 272.0 lb

## 2020-01-13 DIAGNOSIS — Z6791 Unspecified blood type, Rh negative: Secondary | ICD-10-CM

## 2020-01-13 DIAGNOSIS — Z3480 Encounter for supervision of other normal pregnancy, unspecified trimester: Secondary | ICD-10-CM

## 2020-01-13 DIAGNOSIS — Z98891 History of uterine scar from previous surgery: Secondary | ICD-10-CM

## 2020-01-13 DIAGNOSIS — O99210 Obesity complicating pregnancy, unspecified trimester: Secondary | ICD-10-CM

## 2020-01-13 DIAGNOSIS — O360920 Maternal care for other rhesus isoimmunization, second trimester, not applicable or unspecified: Secondary | ICD-10-CM

## 2020-01-13 DIAGNOSIS — O99212 Obesity complicating pregnancy, second trimester: Secondary | ICD-10-CM

## 2020-01-13 DIAGNOSIS — Z3A24 24 weeks gestation of pregnancy: Secondary | ICD-10-CM

## 2020-01-13 NOTE — Progress Notes (Signed)
Blood 118/79 Weight 272

## 2020-01-13 NOTE — Patient Instructions (Signed)

## 2020-01-13 NOTE — Progress Notes (Signed)
   TELEHEALTH OBSTETRICS PRENATAL VIRTUAL VIDEO VISIT ENCOUNTER NOTE  Provider location: [facility]   I connected with Adrienne Berry on 01/13/20 at 10:00 AM EST by MyChart Video Encounter at home and verified that I am speaking with the correct person using two identifiers.   I discussed the limitations, risks, security and privacy concerns of performing an evaluation and management service virtually and the availability of in person appointments. I also discussed with the patient that there may be a patient responsible charge related to this service. The patient expressed understanding and agreed to proceed. Subjective:  Adrienne Berry is a 29 y.o. G2P1001 at [redacted]w[redacted]d being seen today for ongoing prenatal care.  She is currently monitored for the following issues for this low-risk pregnancy and has Pelvic pain; History of cesarean section, low transverse; Supervision of other normal pregnancy, antepartum; Maternal obesity, antepartum; Rh negative, antepartum; Nausea and vomiting during pregnancy; and Hematochezia on their problem list.  Patient reports no complaints.  Contractions: Not present.  .  Movement: Present. Denies any leaking of fluid.   The following portions of the patient's history were reviewed and updated as appropriate: allergies, current medications, past family history, past medical history, past social history, past surgical history and problem list.   Objective:   Vitals:   01/13/20 1008  BP: 118/79  Pulse: 79  Weight: 272 lb (123.4 kg)    Fetal Status:     Movement: Present     General:  Alert, oriented and cooperative. Patient is in no acute distress.  Respiratory: Normal respiratory effort, no problems with respiration noted  Mental Status: Normal mood and affect. Normal behavior. Normal judgment and thought content.  Rest of physical exam deferred due to type of encounter  Imaging: No results found.  Assessment and  Plan:  Pregnancy: G2P1001 at [redacted]w[redacted]d 1. Supervision of other normal pregnancy, antepartum - Routine care  2. History of cesarean section, low transverse - Desires TOLAC, consent next visit  3. Maternal obesity, antepartum   4. Rh negative, antepartum - Rhogam next visit  Preterm labor symptoms and general obstetric precautions including but not limited to vaginal bleeding, contractions, leaking of fluid and fetal movement were reviewed in detail with the patient. I discussed the assessment and treatment plan with the patient. The patient was provided an opportunity to ask questions and all were answered. The patient agreed with the plan and demonstrated an understanding of the instructions. The patient was advised to call back or seek an in-person office evaluation/go to MAU at [facility] for any urgent or concerning symptoms. Please refer to After Visit Summary for other counseling recommendations.   I provided ten minutes of face-to-face time during this encounter.  Return in about 4 weeks (around 02/10/2020) for In person 28 week GTT.  Future Appointments  Date Time Provider Department Center  02/02/2020 10:30 AM [facility] NURSE WH-MFC MFC-US  02/02/2020 10:30 AM WH-MFC [place] 5 WH-MFCUS MFC-US  02/10/2020  8:15 AM Calvert Cantor, CNM [facility]    Calvert Cantor, CNM [facility] [employer], [facility]

## 2020-02-02 ENCOUNTER — Ambulatory Visit (HOSPITAL_COMMUNITY): Payer: Medicaid Other | Admitting: *Deleted

## 2020-02-02 ENCOUNTER — Other Ambulatory Visit: Payer: Self-pay

## 2020-02-02 ENCOUNTER — Other Ambulatory Visit (HOSPITAL_COMMUNITY): Payer: Self-pay | Admitting: *Deleted

## 2020-02-02 ENCOUNTER — Ambulatory Visit (HOSPITAL_COMMUNITY)
Admission: RE | Admit: 2020-02-02 | Discharge: 2020-02-02 | Disposition: A | Discharge status: Home / Self Care | Payer: Medicaid Other | Source: Ambulatory Visit | Attending: Obstetrics and Gynecology | Admitting: Obstetrics and Gynecology

## 2020-02-02 ENCOUNTER — Encounter (HOSPITAL_COMMUNITY): Payer: Self-pay

## 2020-02-02 DIAGNOSIS — Z363 Encounter for antenatal screening for malformations: Secondary | ICD-10-CM | POA: Insufficient documentation

## 2020-02-02 DIAGNOSIS — Z3A27 27 weeks gestation of pregnancy: Secondary | ICD-10-CM | POA: Insufficient documentation

## 2020-02-02 DIAGNOSIS — E669 Obesity, unspecified: Secondary | ICD-10-CM | POA: Insufficient documentation

## 2020-02-02 DIAGNOSIS — Z3480 Encounter for supervision of other normal pregnancy, unspecified trimester: Secondary | ICD-10-CM

## 2020-02-02 DIAGNOSIS — Z6791 Unspecified blood type, Rh negative: Secondary | ICD-10-CM | POA: Diagnosis not present

## 2020-02-02 DIAGNOSIS — O34219 Maternal care for unspecified type scar from previous cesarean delivery: Secondary | ICD-10-CM | POA: Diagnosis not present

## 2020-02-02 DIAGNOSIS — O99212 Obesity complicating pregnancy, second trimester: Secondary | ICD-10-CM | POA: Insufficient documentation

## 2020-02-02 DIAGNOSIS — O26899 Other specified pregnancy related conditions, unspecified trimester: Secondary | ICD-10-CM

## 2020-02-02 DIAGNOSIS — Z362 Encounter for other antenatal screening follow-up: Secondary | ICD-10-CM

## 2020-02-02 DIAGNOSIS — O219 Vomiting of pregnancy, unspecified: Secondary | ICD-10-CM

## 2020-02-02 DIAGNOSIS — O99210 Obesity complicating pregnancy, unspecified trimester: Secondary | ICD-10-CM

## 2020-02-02 DIAGNOSIS — Z98891 History of uterine scar from previous surgery: Secondary | ICD-10-CM

## 2020-02-02 DIAGNOSIS — Z6841 Body Mass Index (BMI) 40.0 and over, adult: Secondary | ICD-10-CM

## 2020-02-10 ENCOUNTER — Encounter: Payer: Medicaid Other | Admitting: Advanced Practice Midwife

## 2020-02-11 ENCOUNTER — Ambulatory Visit (INDEPENDENT_AMBULATORY_CARE_PROVIDER_SITE_OTHER): Payer: Medicaid Other | Admitting: Obstetrics and Gynecology

## 2020-02-11 ENCOUNTER — Other Ambulatory Visit: Payer: Self-pay

## 2020-02-11 VITALS — BP 107/72 | HR 86 | Wt 273.8 lb

## 2020-02-11 DIAGNOSIS — Z6791 Unspecified blood type, Rh negative: Secondary | ICD-10-CM

## 2020-02-11 DIAGNOSIS — Z23 Encounter for immunization: Secondary | ICD-10-CM

## 2020-02-11 DIAGNOSIS — O99210 Obesity complicating pregnancy, unspecified trimester: Secondary | ICD-10-CM

## 2020-02-11 DIAGNOSIS — O360921 Maternal care for other rhesus isoimmunization, second trimester, fetus 1: Secondary | ICD-10-CM

## 2020-02-11 DIAGNOSIS — Z3A28 28 weeks gestation of pregnancy: Secondary | ICD-10-CM | POA: Diagnosis not present

## 2020-02-11 DIAGNOSIS — Z98891 History of uterine scar from previous surgery: Secondary | ICD-10-CM

## 2020-02-11 DIAGNOSIS — Z3402 Encounter for supervision of normal first pregnancy, second trimester: Secondary | ICD-10-CM | POA: Diagnosis not present

## 2020-02-11 DIAGNOSIS — Z3480 Encounter for supervision of other normal pregnancy, unspecified trimester: Secondary | ICD-10-CM

## 2020-02-11 DIAGNOSIS — O26899 Other specified pregnancy related conditions, unspecified trimester: Secondary | ICD-10-CM

## 2020-02-11 MED ORDER — RHO D IMMUNE GLOBULIN 1500 UNIT/2ML IJ SOSY
300.0000 ug | PREFILLED_SYRINGE | Freq: Once | INTRAMUSCULAR | Status: AC
  Administered 2020-02-11: 300 ug via INTRAMUSCULAR

## 2020-02-11 NOTE — Addendum Note (Signed)
Addended by: Cheree Ditto, Jaydon Avina A on: 02/11/2020 09:03 AM   Modules accepted: Orders

## 2020-02-11 NOTE — Addendum Note (Signed)
Addended by: Cheree Ditto, Kalynn Declercq A on: 02/11/2020 10:23 AM   Modules accepted: Orders

## 2020-02-11 NOTE — Progress Notes (Signed)
Prenatal Visit Note Date: 02/11/2020 [facility]  Subjective:  Adrienne Berry is a 29 y.o. G2P1001 at [redacted]w[redacted]d being seen today for ongoing prenatal care.  She is currently monitored for the following issues for this low-risk pregnancy and has Pelvic pain; History of cesarean section, low transverse; Supervision of other normal pregnancy, antepartum; Maternal obesity, antepartum; Rh negative, antepartum; Nausea and vomiting during pregnancy; and Hematochezia on their problem list.  Patient reports no complaints.   Contractions: Not present.  .  Movement: Present. Denies leaking of fluid.   The following portions of the patient's history were reviewed and updated as appropriate: allergies, current medications, past family history, past medical history, past social history, past surgical history and problem list. Problem list updated.  Objective:   Vitals:   02/11/20 0818  BP: 107/72  Pulse: 86  Weight: 273 lb 12.8 oz (124.2 kg)    Fetal Status: Fetal Heart Rate (bpm): 145   Movement: Present     General:  Alert, oriented and cooperative. Patient is in no acute distress.  Skin: Skin is warm and dry. No rash noted.   Cardiovascular: Normal heart rate noted  Respiratory: Normal respiratory effort, no problems with respiration noted  Abdomen: Soft, gravid, appropriate for gestational age. Pain/Pressure: Absent     Pelvic:  Cervical exam deferred        Extremities: Normal range of motion.  Edema: Trace  Mental Status: Normal mood and affect. Normal behavior. Normal judgment and thought content.   Urinalysis:      Assessment and Plan:  Pregnancy: G2P1001 at [redacted]w[redacted]d  1. Encounter for supervision of normal first pregnancy in second trimester Routine care - Glucose Tolerance, 2 Hours w/1 Hour - RPR - CBC - HIV Antibody (routine testing w rflx) - Antibody screen  2. History of cesarean section, low transverse Pt states she got to about 9cm and then had  arrest of dilation. She would like to tolac. R/b d/w her and tolac form signed today (3/18). Pt to decide if she would want an IOL or rpt c/s scheduled and then tolac if she goes into spontaneous labor. She went into spontaenous labor last time.   3. Supervision of other normal pregnancy, antepartum  4. Rh negative, antepartum Rhogam today - Antibody screen  5. Maternal obesity, antepartum  Preterm labor symptoms and general obstetric precautions including but not limited to vaginal bleeding, contractions, leaking of fluid and fetal movement were reviewed in detail with the patient. Please refer to After Visit Summary for other counseling recommendations.  Return in about 2 weeks (around 02/25/2020) for low risk.   [place] Bing, MD

## 2020-02-12 ENCOUNTER — Other Ambulatory Visit: Payer: Self-pay | Admitting: Obstetrics and Gynecology

## 2020-02-12 LAB — CBC
Hematocrit: 33.4 % — ABNORMAL LOW (ref 34.0–46.6)
Hemoglobin: 10.8 g/dL — ABNORMAL LOW (ref 11.1–15.9)
MCH: 29.3 pg (ref 26.6–33.0)
MCHC: 32.3 g/dL (ref 31.5–35.7)
MCV: 91 fL (ref 79–97)
Platelets: 245 10*3/uL (ref 150–450)
RBC: 3.69 x10E6/uL — ABNORMAL LOW (ref 3.77–5.28)
RDW: 12.4 % (ref 11.7–15.4)
WBC: 10.9 10*3/uL — ABNORMAL HIGH (ref 3.4–10.8)

## 2020-02-12 LAB — GLUCOSE TOLERANCE, 2 HOURS W/ 1HR
Glucose, 1 hour: 124 mg/dL (ref 65–179)
Glucose, 2 hour: 96 mg/dL (ref 65–152)
Glucose, Fasting: 71 mg/dL (ref 65–91)

## 2020-02-12 LAB — RPR: RPR Ser Ql: NONREACTIVE

## 2020-02-12 LAB — ANTIBODY SCREEN: Antibody Screen: NEGATIVE

## 2020-02-12 LAB — HIV ANTIBODY (ROUTINE TESTING W REFLEX): HIV Screen 4th Generation wRfx: NONREACTIVE

## 2020-02-12 MED ORDER — DOCUSATE SODIUM 100 MG PO CAPS: 100.0000 mg | ORAL_CAPSULE | Freq: Two times a day (BID) | ORAL | 3 refills | Status: DC | PRN

## 2020-02-12 MED ORDER — FERROUS GLUCONATE 324 (38 FE) MG PO TABS: 324.0000 mg | ORAL_TABLET | Freq: Every day | ORAL | 1 refills | Status: DC

## 2020-02-25 ENCOUNTER — Other Ambulatory Visit: Payer: Self-pay

## 2020-02-25 ENCOUNTER — Encounter: Payer: Self-pay | Admitting: Obstetrics & Gynecology

## 2020-02-25 ENCOUNTER — Telehealth (INDEPENDENT_AMBULATORY_CARE_PROVIDER_SITE_OTHER): Payer: Medicaid Other | Admitting: Obstetrics & Gynecology

## 2020-02-25 DIAGNOSIS — Z3480 Encounter for supervision of other normal pregnancy, unspecified trimester: Secondary | ICD-10-CM

## 2020-02-25 DIAGNOSIS — O34219 Maternal care for unspecified type scar from previous cesarean delivery: Secondary | ICD-10-CM

## 2020-02-25 DIAGNOSIS — Z98891 History of uterine scar from previous surgery: Secondary | ICD-10-CM

## 2020-02-25 DIAGNOSIS — Z6791 Unspecified blood type, Rh negative: Secondary | ICD-10-CM

## 2020-02-25 DIAGNOSIS — O360130 Maternal care for anti-D [Rh] antibodies, third trimester, not applicable or unspecified: Secondary | ICD-10-CM

## 2020-02-25 DIAGNOSIS — Z3A30 30 weeks gestation of pregnancy: Secondary | ICD-10-CM

## 2020-02-25 NOTE — Patient Instructions (Signed)
Return to office for any scheduled appointments. Call the office or go to the [facility] at [facility] if:  You begin to have strong, frequent contractions  Your water breaks.  Sometimes it is a big gush of fluid, sometimes it is just a trickle that keeps getting your panties wet or running down your legs  You have vaginal bleeding.  It is normal to have a small amount of spotting if your cervix was checked.   You do not feel your baby moving like normal.  If you do not, get something to eat and drink and lay down and focus on feeling your baby move.   If your baby is still not moving like normal, you should call the office or go to MAU.  Any other obstetric concerns.   

## 2020-02-25 NOTE — Progress Notes (Signed)
OBSTETRICS PRENATAL VIRTUAL VISIT ENCOUNTER NOTE  Provider location: Center for [facility] at [facility]   I connected with Adrienne Berry on 02/25/20 at 10:30 AM EDT by MyChart Video Encounter at home and verified that I am speaking with the correct person using two identifiers.   I discussed the limitations, risks, security and privacy concerns of performing an evaluation and management service virtually and the availability of in person appointments. I also discussed with the patient that there may be a patient responsible charge related to this service. The patient expressed understanding and agreed to proceed. Subjective:  Adrienne Berry is a 29 y.o. G2P1001 at [redacted]w[redacted]d being seen today for ongoing prenatal care.  She is currently monitored for the following issues for this low-risk pregnancy and has Pelvic pain; History of cesarean section, low transverse; Supervision of other normal pregnancy, antepartum; Maternal obesity, antepartum; Rh negative, antepartum; Nausea and vomiting during pregnancy; and Hematochezia on their problem list.  Patient reports no complaints.  Contractions: Not present. Vag. Bleeding: None.  Movement: Present. Denies any leaking of fluid.   The following portions of the patient's history were reviewed and updated as appropriate: allergies, current medications, past family history, past medical history, past social history, past surgical history and problem list.   Objective:   Vitals:   02/25/20 1021  BP: 117/78    Fetal Status:     Movement: Present     General:  Alert, oriented and cooperative. Patient is in no acute distress.  Respiratory: Normal respiratory effort, no problems with respiration noted  Mental Status: Normal mood and affect. Normal behavior. Normal judgment and thought content.  Rest of physical exam deferred due to type of encounter   Labs: Results for orders placed or performed in visit on 02/11/20 (from the past 504  hour(s))  Glucose Tolerance, 2 Hours w/1 Hour   Collection Time: 02/11/20  8:37 AM  Result Value Ref Range   Glucose, Fasting 71 65 - 91 mg/dL   Glucose, 1 hour 096 65 - 179 mg/dL   Glucose, 2 hour 96 65 - 152 mg/dL  RPR   Collection Time: 02/11/20  8:37 AM  Result Value Ref Range   RPR Ser Ql Non Reactive Non Reactive  CBC   Collection Time: 02/11/20  8:37 AM  Result Value Ref Range   WBC 10.9 (H) 3.4 - 10.8 x10E3/uL   RBC 3.69 (L) 3.77 - 5.28 x10E6/uL   Hemoglobin 10.8 (L) 11.1 - 15.9 g/dL   Hematocrit 28.3 (L) 66.2 - 46.6 %   MCV 91 79 - 97 fL   MCH 29.3 26.6 - 33.0 pg   MCHC 32.3 31.5 - 35.7 g/dL   RDW 94.7 65.4 - 65.0 %   Platelets 245 150 - 450 x10E3/uL  HIV Antibody (routine testing w rflx)   Collection Time: 02/11/20  8:37 AM  Result Value Ref Range   HIV Screen 4th Generation wRfx Non Reactive Non Reactive  Antibody screen   Collection Time: 02/11/20  8:39 AM  Result Value Ref Range   Antibody Screen Negative Negative    Imaging: [place] MFM OB FOLLOW UP  Result Date: 02/02/2020 ----------------------------------------------------------------------  OBSTETRICS REPORT                       (Signed Final 02/02/2020 11:51 am) ---------------------------------------------------------------------- Patient Info  ID #:       [id number]  D.O.B.:  14-Feb-1991 (29 yrs)  Name:       Adrienne Berry               Visit Date: 02/02/2020 10:54 am ---------------------------------------------------------------------- Performed By  Performed By:     Birdena Crandall        Ref. Address:     [address]                                                             [place], [place]                                                             [address]  Attending:        Ma Rings MD         [facility]  Referred By:      La Alianza Bing MD ---------------------------------------------------------------------- Orders   #  Description                          Code         Ordered By   1  [place] MFM OB FOLLOW UP                  15400.86     Noralee Space  ----------------------------------------------------------------------   #  Order #                    Accession #                 Episode #   1  [id number]                  [id number]                  [id number]  ---------------------------------------------------------------------- Indications   Obesity complicating pregnancy, second         O99.212   trimester (BMI:40)   Encounter for antenatal screening for          Z36.3   malformations   History  of cesarean delivery, currently        O34.219   pregnant (low transvers)   Rh negative state in antepartum                O36.0190   Low risk (NIPS:low risk , 7% FF, female)   [redacted] weeks gestation of pregnancy                Z3A.27  ---------------------------------------------------------------------- Vital Signs                                                 Height:        6' ---------------------------------------------------------------------- Fetal Evaluation  Num Of Fetuses:         1  Fetal Heart Rate(bpm):  134  Cardiac Activity:       Observed  Presentation:           Transverse, head to maternal left  Placenta:               Posterior  P. Cord Insertion:      Previously Visualized  Amniotic Fluid  AFI FV:      Within normal limits                              Largest Pocket(cm)                              6.6 ---------------------------------------------------------------------- Biometry  BPD:      65.1  mm     G. Age:  26w 2d         18  %    CI:        70.88   %    70 - 86                                                          FL/HC:      20.0   %    18.6 - 20.4  HC:      246.4  mm     G. Age:  26w 5d         16  %    HC/AC:      1.05        1.05 - 1.21  AC:      235.7  mm     G.  Age:  27w 6d         69  %    FL/BPD:     75.9   %    71 - 87  FL:       49.4  mm     G. Age:  26w 5d         25  %    FL/AC:      21.0   %    20 - 24  Est. FW:    1048  gm      2 lb 5 oz     48  % ---------------------------------------------------------------------- OB History  Gravidity:    2  Term:   1  Living:       1 ---------------------------------------------------------------------- Gestational Age  LMP:           27w 0d        Date:  07/28/19                 EDD:   05/03/20  U/S Today:     26w 6d                                        EDD:   05/04/20  Best:          27w 0d     Det. By:  LMP  (07/28/19)          EDD:   05/03/20 ---------------------------------------------------------------------- Anatomy  Cranium:               Appears normal         LVOT:                   Appears normal  Cavum:                 Appears normal         Aortic Arch:            Appears normal  Ventricles:            Appears normal         Ductal Arch:            Previously seen  Choroid Plexus:        Previously seen        Diaphragm:              Previously seen  Cerebellum:            Appears normal         Stomach:                Appears normal, left                                                                        sided  Posterior Fossa:       Appears normal         Abdomen:                Appears normal  Nuchal Fold:           Appears normal         Abdominal Wall:         Appears nml (cord                                                                        insert, abd wall)  Face:                  Appears normal  Cord Vessels:           Appears normal (3                         (orbits and profile)                           vessel cord)  Lips:                  Appears normal         Kidneys:                Appear normal  Palate:                Appears normal         Bladder:                Appears normal  Thoracic:              Appears normal         Spine:                  Previously seen  Heart:                  Appears normal         Upper Extremities:      Previously seen                         (4CH, axis, and                         situs)  RVOT:                  Appears normal         Lower Extremities:      Previously seen  Other:  Technically difficult due to maternal habitus and fetal position. Female          gender. LT Heel previously visualized. ---------------------------------------------------------------------- Cervix Uterus Adnexa  Cervix  Normal appearance by transabdominal scan.  Uterus  No abnormality visualized. ---------------------------------------------------------------------- Comments  This patient was seen for a follow up growth scan due to  maternal obesity.  She denies any problems since her last  exam.  She was informed that the fetal growth and amniotic fluid  level appears appropriate for her gestational age.  As her BMI is over 40, a follow-up growth ultrasound was  scheduled in 5 to 6 weeks. ----------------------------------------------------------------------                   Johnell Comings, MD Electronically Signed Final Report   02/02/2020 11:51 am ----------------------------------------------------------------------   Assessment and Plan:  Pregnancy: G2P1001 at [redacted]w[redacted]d 1. Rh negative, antepartum Received Rhogam last visit  2. History of cesarean section, low transverse Already consented for TOLAC  3. Supervision of other normal pregnancy, antepartum Follow up scan in 2 weeks, will follow up results and manage accordingly. Normal third trimester labs except mild anemia, she is on oral iron therapy.  Preterm labor symptoms and general obstetric precautions including but not limited to vaginal bleeding, contractions, leaking of fluid and fetal movement were reviewed in detail with the patient. I discussed the assessment and treatment plan with the patient. The patient was provided an opportunity to ask questions and all were answered. The patient agreed with the  plan and demonstrated an understanding of the instructions. The patient was advised to call back or seek an in-person office evaluation/go to MAU at [facility] for any urgent or concerning symptoms. Please refer to After Visit Summary for other counseling recommendations.   I provided 6 minutes of face-to-face time during this encounter.  Return in about 2 weeks (around 03/10/2020) for Virtual OB Visit.  Future Appointments  Date Time Provider Department Center  03/09/2020  9:00 AM Calvert Cantor, CNM [facility]  03/10/2020 11:15 AM [facility] NURSE [facility]  03/10/2020 11:15 AM [facility] [place] 4 [facility]    Jaynie Collins, MD Center for [facility], [facility]

## 2020-02-25 NOTE — Progress Notes (Signed)
I connected with  Adrienne Berry on 02/25/20 at 10:30 AM EDT by telephone and verified that I am speaking with the correct person using two identifiers.   I discussed the limitations, risks, security and privacy concerns of performing an evaluation and management service by telephone and the availability of in person appointments. I also discussed with the patient that there may be a patient responsible charge related to this service. The patient expressed understanding and agreed to proceed.  [name], RN 02/25/2020  10:23 AM

## 2020-03-09 ENCOUNTER — Other Ambulatory Visit: Payer: Self-pay

## 2020-03-09 ENCOUNTER — Telehealth (INDEPENDENT_AMBULATORY_CARE_PROVIDER_SITE_OTHER): Payer: Medicaid Other | Admitting: Advanced Practice Midwife

## 2020-03-09 DIAGNOSIS — Z3009 Encounter for other general counseling and advice on contraception: Secondary | ICD-10-CM

## 2020-03-09 DIAGNOSIS — O99210 Obesity complicating pregnancy, unspecified trimester: Secondary | ICD-10-CM

## 2020-03-09 DIAGNOSIS — Z6791 Unspecified blood type, Rh negative: Secondary | ICD-10-CM

## 2020-03-09 DIAGNOSIS — O26899 Other specified pregnancy related conditions, unspecified trimester: Secondary | ICD-10-CM

## 2020-03-09 DIAGNOSIS — Z3A32 32 weeks gestation of pregnancy: Secondary | ICD-10-CM

## 2020-03-09 DIAGNOSIS — O360130 Maternal care for anti-D [Rh] antibodies, third trimester, not applicable or unspecified: Secondary | ICD-10-CM

## 2020-03-09 DIAGNOSIS — Z3480 Encounter for supervision of other normal pregnancy, unspecified trimester: Secondary | ICD-10-CM

## 2020-03-09 DIAGNOSIS — Z98891 History of uterine scar from previous surgery: Secondary | ICD-10-CM

## 2020-03-09 DIAGNOSIS — E669 Obesity, unspecified: Secondary | ICD-10-CM

## 2020-03-09 DIAGNOSIS — O99213 Obesity complicating pregnancy, third trimester: Secondary | ICD-10-CM

## 2020-03-09 DIAGNOSIS — O34219 Maternal care for unspecified type scar from previous cesarean delivery: Secondary | ICD-10-CM

## 2020-03-09 NOTE — Progress Notes (Signed)
I connected with  Adrienne Berry on 03/09/20 at  9:00 AM EDT by telephone and verified that I am speaking with the correct person using two identifiers.   I discussed the limitations, risks, security and privacy concerns of performing an evaluation and management service by telephone and the availability of in person appointments. I also discussed with the patient that there may be a patient responsible charge related to this service. The patient expressed understanding and agreed to proceed.  [name], RN 03/09/2020  9:03 AM   Will take BP when she gets home today and send it via mychart  Can she take vitamin b-12 to help with energy??

## 2020-03-09 NOTE — Progress Notes (Addendum)
   OBSTETRICS PRENATAL VIRTUAL VISIT ENCOUNTER NOTE  Provider location: [facility] for [facility] at [facility]   I connected with [name] on 03/09/20 at  9:00 AM EDT by MyChart Video Encounter at home and verified that I am speaking with the correct person using two identifiers.   I discussed the limitations, risks, security and privacy concerns of performing an evaluation and management service virtually and the availability of in person appointments. I also discussed with the patient that there may be a patient responsible charge related to this service. The patient expressed understanding and agreed to proceed. Subjective:  Adrienne Berry is a 29 y.o. G2P1001 at [redacted]w[redacted]d being seen today for ongoing prenatal care.  She is currently monitored for the following issues for this low-risk pregnancy and has Pelvic pain; History of cesarean section, low transverse; Supervision of other normal pregnancy, antepartum; Maternal obesity, antepartum; Rh negative, antepartum; Nausea and vomiting during pregnancy; and Hematochezia on their problem list.  Patient reports interrupted sleep due to fetal movement and multiple bathroom trips.  Contractions: Not present. Vag. Bleeding: None.  Movement: Present. Denies any leaking of fluid.   The following portions of the patient's history were reviewed and updated as appropriate: allergies, current medications, past family history, past medical history, past social history, past surgical history and problem list.   Objective:  There were no vitals filed for this visit.  Fetal Status:     Movement: Present     General:  Alert, oriented and cooperative. Patient is in no acute distress.  Respiratory: Normal respiratory effort, no problems with respiration noted  Mental Status: Normal mood and affect. Normal behavior. Normal judgment and thought content.  Rest of physical exam deferred due to type of encounter  Imaging: No results found.   Assessment and Plan:  Pregnancy: G2P1001 at [redacted]w[redacted]d 1. Supervision of other normal pregnancy, antepartum - Confirmed B12 safe in pregnancy, obtain from mainstream source - Advised increasing hydration, diet revision with lean protein and whole grains to increase energy - May restart Tylenol PM at bedtime to assist with insomnia  2. Rh negative, antepartum --S/p Rhogam 02/11/2020  3. History of cesarean section, low transverse --TOLAC consent signed 02/11/2020 with Dr. Vergie Living  4. Maternal obesity, antepartum --EFW 48% 02/02/2020 at 27w - Next scan 03/10/2020  5. Unwanted Fertility --Patient considering interval BTL vs partner vasectomy  Preterm labor symptoms and general obstetric precautions including but not limited to vaginal bleeding, contractions, leaking of fluid and fetal movement were reviewed in detail with the patient. I discussed the assessment and treatment plan with the patient. The patient was provided an opportunity to ask questions and all were answered. The patient agreed with the plan and demonstrated an understanding of the instructions. The patient was advised to call back or seek an in-person office evaluation/go to MAU at [facility] for any urgent or concerning symptoms. Please refer to After Visit Summary for other counseling recommendations.   I provided ten minutes of face-to-face time during this encounter.  Future Appointments  Date Time Provider Department Center  03/10/2020 11:15 AM [facility] NURSE [facility]  03/10/2020 11:15 AM [facility] [place] 4 [facility]  03/23/2020 11:15 AM Calvert Cantor, CNM [facility]  04/07/2020  9:30 AM King Bing, MD [facility]    Calvert Cantor, CNM Center for [employer], [facility]

## 2020-03-10 ENCOUNTER — Ambulatory Visit (HOSPITAL_COMMUNITY): Payer: Medicaid Other | Admitting: *Deleted

## 2020-03-10 ENCOUNTER — Other Ambulatory Visit: Payer: Self-pay

## 2020-03-10 ENCOUNTER — Encounter (HOSPITAL_COMMUNITY): Payer: Self-pay

## 2020-03-10 ENCOUNTER — Ambulatory Visit (HOSPITAL_COMMUNITY)
Admission: RE | Admit: 2020-03-10 | Discharge: 2020-03-10 | Disposition: A | Discharge status: Home / Self Care | Payer: Medicaid Other | Source: Ambulatory Visit | Attending: Obstetrics | Admitting: Obstetrics

## 2020-03-10 DIAGNOSIS — O219 Vomiting of pregnancy, unspecified: Secondary | ICD-10-CM

## 2020-03-10 DIAGNOSIS — O34219 Maternal care for unspecified type scar from previous cesarean delivery: Secondary | ICD-10-CM | POA: Insufficient documentation

## 2020-03-10 DIAGNOSIS — Z3A32 32 weeks gestation of pregnancy: Secondary | ICD-10-CM | POA: Diagnosis not present

## 2020-03-10 DIAGNOSIS — O99213 Obesity complicating pregnancy, third trimester: Secondary | ICD-10-CM

## 2020-03-10 DIAGNOSIS — E669 Obesity, unspecified: Secondary | ICD-10-CM | POA: Diagnosis not present

## 2020-03-10 DIAGNOSIS — Z362 Encounter for other antenatal screening follow-up: Secondary | ICD-10-CM

## 2020-03-10 DIAGNOSIS — O99210 Obesity complicating pregnancy, unspecified trimester: Secondary | ICD-10-CM

## 2020-03-10 DIAGNOSIS — Z6841 Body Mass Index (BMI) 40.0 and over, adult: Secondary | ICD-10-CM

## 2020-03-10 DIAGNOSIS — Z6791 Unspecified blood type, Rh negative: Secondary | ICD-10-CM

## 2020-03-10 DIAGNOSIS — Z98891 History of uterine scar from previous surgery: Secondary | ICD-10-CM

## 2020-03-10 DIAGNOSIS — Z3480 Encounter for supervision of other normal pregnancy, unspecified trimester: Secondary | ICD-10-CM

## 2020-03-23 ENCOUNTER — Telehealth (INDEPENDENT_AMBULATORY_CARE_PROVIDER_SITE_OTHER): Payer: Medicaid Other | Admitting: Advanced Practice Midwife

## 2020-03-23 ENCOUNTER — Other Ambulatory Visit: Payer: Self-pay

## 2020-03-23 VITALS — BP 121/76

## 2020-03-23 DIAGNOSIS — O99213 Obesity complicating pregnancy, third trimester: Secondary | ICD-10-CM

## 2020-03-23 DIAGNOSIS — M549 Dorsalgia, unspecified: Secondary | ICD-10-CM

## 2020-03-23 DIAGNOSIS — Z3A34 34 weeks gestation of pregnancy: Secondary | ICD-10-CM

## 2020-03-23 DIAGNOSIS — Z3480 Encounter for supervision of other normal pregnancy, unspecified trimester: Secondary | ICD-10-CM

## 2020-03-23 DIAGNOSIS — O99891 Other specified diseases and conditions complicating pregnancy: Secondary | ICD-10-CM

## 2020-03-23 DIAGNOSIS — O34211 Maternal care for low transverse scar from previous cesarean delivery: Secondary | ICD-10-CM

## 2020-03-23 DIAGNOSIS — E669 Obesity, unspecified: Secondary | ICD-10-CM

## 2020-03-23 DIAGNOSIS — Z98891 History of uterine scar from previous surgery: Secondary | ICD-10-CM

## 2020-03-23 DIAGNOSIS — O99210 Obesity complicating pregnancy, unspecified trimester: Secondary | ICD-10-CM

## 2020-03-23 MED ORDER — CYCLOBENZAPRINE HCL 10 MG PO TABS: 10.0000 mg | ORAL_TABLET | Freq: Three times a day (TID) | ORAL | 2 refills | Status: DC | PRN

## 2020-03-23 NOTE — Progress Notes (Signed)
OBSTETRICS PRENATAL VIRTUAL VISIT ENCOUNTER NOTE  Provider location: [facility] [place] [facility]   I connected with Adrienne Berry on 03/23/20 at 11:15 AM EDT by MyChart Video Encounter at home and verified that I am speaking with the correct person using two identifiers.   I discussed the limitations, risks, security and privacy concerns of performing an evaluation and management service virtually and the availability of in person appointments. I also discussed with the patient that there may be a patient responsible charge related to this service. The patient expressed understanding and agreed to proceed. Subjective:  Adrienne Berry is a 29 y.o. G2P1001 at [redacted]w[redacted]d being seen today for ongoing prenatal care.  She is currently monitored for the following issues for this low-risk pregnancy and has Pelvic pain; History of cesarean section, low transverse; Supervision of other normal pregnancy, antepartum; Maternal obesity, antepartum; Rh negative, antepartum; Nausea and vomiting during pregnancy; and Hematochezia on their problem list.  Patient reports backache which occasionally interrupts her sleep. She is managing with Tylenol PM which she takes for insomnia.  Contractions: Not present. Vag. Bleeding: None.  Movement: Present. Denies any leaking of fluid.   The following portions of the patient's history were reviewed and updated as appropriate: allergies, current medications, past family history, past medical history, past social history, past surgical history and problem list.   Objective:   Vitals:   03/23/20 1100  BP: 121/76    Fetal Status:     Movement: Present     General:  Alert, oriented and cooperative. Patient is in no acute distress.  Respiratory: Normal respiratory effort, no problems with respiration noted  Mental Status: Normal mood and affect. Normal behavior. Normal judgment and thought content.  Rest of physical exam deferred due to type of  encounter  Imaging: [place] MFM OB FOLLOW UP  Result Date: 03/10/2020 ----------------------------------------------------------------------  OBSTETRICS REPORT                       (Signed Final 03/10/2020 01:48 pm) ---------------------------------------------------------------------- Patient Info  ID #:       [id number]                          D.O.B.:  [birth year] (29 yrs)  Name:       Adrienne Berry               Visit Date: 03/10/2020 11:56 am ---------------------------------------------------------------------- Performed By  Performed By:     Jeanene Erb BS,      Ref. Address:     [address]                    [place], [place]  [address]  Attending:        Noralee Space MD        [facility]  Referred By:      [place] Bing MD ---------------------------------------------------------------------- Orders   #  Description                          Code         Ordered By   1  [place] MFM OB FOLLOW UP                  60454.09     Rosana Hoes  ----------------------------------------------------------------------   #  Order #                    Accession #                 Episode #   1  [id number]                  [id number]                  [id number]  ---------------------------------------------------------------------- Indications   Obesity complicating pregnancy, third          O99.213   trimester   Rh negative state in antepartum                O36.0190   Low risk (NIPS:low risk , 7% FF, female)   Encounter for other antenatal screening        Z36.2   follow-up   [redacted] weeks gestation of pregnancy                Z3A.32   History of cesarean delivery, currently        O34.219   pregnant   ---------------------------------------------------------------------- Vital Signs  Weight (lb): 273                               Height:        6'  BMI:         37.02 ---------------------------------------------------------------------- Fetal Evaluation  Num Of Fetuses:         1  Fetal Heart Rate(bpm):  148  Cardiac Activity:       Observed  Presentation:           Cephalic  Placenta:               Posterior  P. Cord Insertion:      Previously Visualized  Amniotic Fluid  AFI FV:      Within normal limits  AFI Sum(cm)     %Tile       Largest Pocket(cm)  11.89           31          4.34  RUQ(cm)       RLQ(cm)  LUQ(cm)        LLQ(cm)  3.69          1.98          1.88           4.34 ---------------------------------------------------------------------- Biometry  BPD:      77.2  mm     G. Age:  31w 0d         10  %    CI:        71.47   %    70 - 86                                                          FL/HC:      21.3   %    19.1 - 21.3  HC:      290.8  mm     G. Age:  32w 0d         11  %    HC/AC:      1.02        0.96 - 1.17  AC:      285.9  mm     G. Age:  32w 4d         59  %    FL/BPD:     80.2   %    71 - 87  FL:       61.9  mm     G. Age:  32w 1d         32  %    FL/AC:      21.7   %    20 - 24  Est. FW:    1934  gm      4 lb 4 oz     38  % ---------------------------------------------------------------------- OB History  Gravidity:    2         Term:   1  Living:       1 ---------------------------------------------------------------------- Gestational Age  LMP:           32w 2d        Date:  07/28/19                 EDD:   05/03/20  U/S Today:     32w 0d                                        EDD:   05/05/20  Best:          32w 2d     Det. By:  LMP  (07/28/19)          EDD:   05/03/20 ---------------------------------------------------------------------- Anatomy  Cranium:               Appears normal         LVOT:                   Previously seen  Cavum:                 Previously seen         Aortic Arch:            Previously seen  Ventricles:  Previously seen        Ductal Arch:            Previously seen  Choroid Plexus:        Previously seen        Diaphragm:              Previously seen  Cerebellum:            Previously seen        Stomach:                Appears normal, left                                                                        sided  Posterior Fossa:       Previously seen        Abdomen:                Appears normal  Nuchal Fold:           Previously seen        Abdominal Wall:         Previously seen  Face:                  Orbits and profile     Cord Vessels:           Previously seen                         previously seen  Lips:                  Previously seen        Kidneys:                Appear normal  Palate:                Previously seen        Bladder:                Appears normal  Thoracic:              Appears normal         Spine:                  Previously seen  Heart:                 Appears normal         Upper Extremities:      Previously seen                         (4CH, axis, and                         situs)  RVOT:                  Previously seen        Lower Extremities:      Previously seen  Other:  Technically difficult due to maternal habitus and fetal position. Female          gender. LT Heel previously visualized. ---------------------------------------------------------------------- Cervix Uterus Adnexa  Cervix  Not visualized (advanced GA >24wks) ---------------------------------------------------------------------- Impression  Maternal obesity.Patient returned for fetal growth  assessment. Amniotic fluid is normal and good fetal activity is  seen. Fetal growth is appropriate for gestational age. Patient  does not have gestational diabetes. ---------------------------------------------------------------------- Recommendations  Follow-up scans as clinically indicated.  ----------------------------------------------------------------------                  Noralee Spaceavi Shankar, MD Electronically Signed Final Report   03/10/2020 01:48 pm ----------------------------------------------------------------------   Assessment and Plan:  Pregnancy: G2P1001 at 3656w1d 1. Supervision of other normal pregnancy, antepartum - Routine care - Ongoing discussion of partner vasectomy. Medicaid coverage information sent via MyChart message  2. History of cesarean section, low transverse - For TOLAC  3. Maternal obesity, antepartum - Serial growth with MFM - EFW 33% as of 03/10/2020  4. Back pain affecting pregnancy in third trimester - New rx Flexeril, initiate at night due to expected side effect of sleepiness  Preterm labor symptoms and general obstetric precautions including but not limited to vaginal bleeding, contractions, leaking of fluid and fetal movement were reviewed in detail with the patient. I discussed the assessment and treatment plan with the patient. The patient was provided an opportunity to ask questions and all were answered. The patient agreed with the plan and demonstrated an understanding of the instructions. The patient was advised to call back or seek an in-person office evaluation/[facility] for any urgent or concerning symptoms. Please refer to After Visit Summary for other counseling recommendations.   I provided ten minutes of face-to-face time during this encounter.   Future Appointments  Date Time Provider Department Center  04/07/2020  9:30 AM [place] BingPickens, Charlie, MD [facility]    [place] CantorSamantha C Alexea Blase, CNM [facility], [facility]

## 2020-03-23 NOTE — Progress Notes (Signed)
I connected with  [name] on 03/23/20 at 11:15 AM EDT by telephone and verified that I am speaking with the correct person using two identifiers.   I discussed the limitations, risks, security and privacy concerns of performing an evaluation and management service by telephone and the availability of in person appointments. I also discussed with the patient that there may be a patient responsible charge related to this service. The patient expressed understanding and agreed to proceed.  [name], RN 03/23/2020  11:01 AM

## 2020-04-06 ENCOUNTER — Encounter: Payer: Medicaid Other | Admitting: Advanced Practice Midwife

## 2020-04-07 ENCOUNTER — Other Ambulatory Visit (HOSPITAL_COMMUNITY)
Admission: RE | Admit: 2020-04-07 | Discharge: 2020-04-07 | Disposition: A | Discharge status: Home / Self Care | Payer: Medicaid Other | Source: Ambulatory Visit | Attending: Advanced Practice Midwife | Admitting: Advanced Practice Midwife

## 2020-04-07 ENCOUNTER — Ambulatory Visit (INDEPENDENT_AMBULATORY_CARE_PROVIDER_SITE_OTHER): Payer: Medicaid Other | Admitting: Obstetrics and Gynecology

## 2020-04-07 ENCOUNTER — Other Ambulatory Visit: Payer: Self-pay

## 2020-04-07 VITALS — BP 117/76 | HR 71

## 2020-04-07 DIAGNOSIS — Z98891 History of uterine scar from previous surgery: Secondary | ICD-10-CM

## 2020-04-07 DIAGNOSIS — Z3480 Encounter for supervision of other normal pregnancy, unspecified trimester: Secondary | ICD-10-CM | POA: Diagnosis not present

## 2020-04-07 DIAGNOSIS — O26899 Other specified pregnancy related conditions, unspecified trimester: Secondary | ICD-10-CM

## 2020-04-07 DIAGNOSIS — Z6791 Unspecified blood type, Rh negative: Secondary | ICD-10-CM

## 2020-04-07 NOTE — Progress Notes (Signed)
Prenatal Visit Note Date: 04/07/2020 Clinic: [facility]  Subjective:  Adrienne Berry is a 29 y.o. G2P1001 at [redacted]w[redacted]d being seen today for ongoing prenatal care.  She is currently monitored for the following issues for this low-risk pregnancy and has Pelvic pain; History of cesarean section, low transverse; Supervision of other normal pregnancy, antepartum; Maternal obesity, antepartum; Rh negative, antepartum; Nausea and vomiting during pregnancy; and Hematochezia on their problem list.  Patient reports no complaints.   Contractions: Not present. Vag. Bleeding: None.  Movement: Present. Denies leaking of fluid.   The following portions of the patient's history were reviewed and updated as appropriate: allergies, current medications, past family history, past medical history, past social history, past surgical history and problem list. Problem list updated.  Objective:   Vitals:   04/07/20 0933  BP: 117/76  Pulse: 71    Fetal Status: Fetal Heart Rate (bpm): 160 Fundal Height: 36 cm Movement: Present  Presentation: Vertex  General:  Alert, oriented and cooperative. Patient is in no acute distress.  Skin: Skin is warm and dry. No rash noted.   Cardiovascular: Normal heart rate noted  Respiratory: Normal respiratory effort, no problems with respiration noted  Abdomen: Soft, gravid, appropriate for gestational age. Pain/Pressure: Present     Pelvic:  Cervical exam performed Dilation: Fingertip Effacement (%): 20 Station: Ballotable  Extremities: Normal range of motion.  Edema: Trace  Mental Status: Normal mood and affect. Normal behavior. Normal judgment and thought content.   Urinalysis:      Assessment and Plan:  Pregnancy: G2P1001 at [redacted]w[redacted]d  1. Supervision of other normal pregnancy, antepartum - Culture, beta strep (group b only) - GC/Chlamydia probe amp ([place])not at [facility]  2. Rh negative, antepartum No issues  3. History of cesarean section, low  transverse Pelvic arch feels narrow. Patient got to 9cm per patient and infant was only 6lbs 11oz. Marland Kitchen Op note does say anything about station. I told her that I would lean against a tolac based on her exam today and strongly consider a c-section, but if she still wanted to tolac then that's fine, too, but that we would keep a close eye on her labor curve.  - Culture, beta strep (group b only) - GC/Chlamydia probe amp ([place])not at [facility]  Preterm labor symptoms and general obstetric precautions including but not limited to vaginal bleeding, contractions, leaking of fluid and fetal movement were reviewed in detail with the patient. Please refer to After Visit Summary for other counseling recommendations.  Return in about 1 week (around 04/14/2020) for virtual visit, low risk, high risk.    Bing, MD

## 2020-04-08 LAB — GC/CHLAMYDIA PROBE AMP (~~LOC~~) NOT AT ARMC
Chlamydia: NEGATIVE
Comment: NEGATIVE
Comment: NORMAL
Neisseria Gonorrhea: NEGATIVE

## 2020-04-11 LAB — CULTURE, BETA STREP (GROUP B ONLY): Strep Gp B Culture: NEGATIVE

## 2020-04-13 ENCOUNTER — Encounter: Payer: Self-pay | Admitting: *Deleted

## 2020-04-14 ENCOUNTER — Telehealth: Payer: Medicaid Other | Admitting: Obstetrics and Gynecology

## 2020-04-14 ENCOUNTER — Other Ambulatory Visit: Payer: Self-pay

## 2020-04-14 ENCOUNTER — Telehealth (INDEPENDENT_AMBULATORY_CARE_PROVIDER_SITE_OTHER): Payer: Medicaid Other | Admitting: Obstetrics and Gynecology

## 2020-04-14 DIAGNOSIS — O99213 Obesity complicating pregnancy, third trimester: Secondary | ICD-10-CM

## 2020-04-14 DIAGNOSIS — Z3A37 37 weeks gestation of pregnancy: Secondary | ICD-10-CM

## 2020-04-14 DIAGNOSIS — Z3480 Encounter for supervision of other normal pregnancy, unspecified trimester: Secondary | ICD-10-CM

## 2020-04-14 DIAGNOSIS — O99210 Obesity complicating pregnancy, unspecified trimester: Secondary | ICD-10-CM

## 2020-04-14 DIAGNOSIS — Z6791 Unspecified blood type, Rh negative: Secondary | ICD-10-CM

## 2020-04-14 DIAGNOSIS — O34211 Maternal care for low transverse scar from previous cesarean delivery: Secondary | ICD-10-CM

## 2020-04-14 DIAGNOSIS — O219 Vomiting of pregnancy, unspecified: Secondary | ICD-10-CM | POA: Diagnosis not present

## 2020-04-14 DIAGNOSIS — Z98891 History of uterine scar from previous surgery: Secondary | ICD-10-CM

## 2020-04-14 NOTE — Progress Notes (Signed)
I connected with  [name] on 04/14/20 at  2:00 PM EDT by telephone and verified that I am speaking with the correct person using two identifiers.   I discussed the limitations, risks, security and privacy concerns of performing an evaluation and management service by telephone and the availability of in person appointments. I also discussed with the patient that there may be a patient responsible charge related to this service. The patient expressed understanding and agreed to proceed.  [name], RN 04/14/2020  2:07 PM    Will send BP in a mychart message

## 2020-04-15 ENCOUNTER — Other Ambulatory Visit: Payer: Self-pay | Admitting: Obstetrics and Gynecology

## 2020-04-17 NOTE — Progress Notes (Signed)
   TELEHEALTH VIRTUAL OBSTETRICS VISIT ENCOUNTER NOTE  Clinic: [facility]  I connected with Adrienne Berry on 04/14/20 at  2:00 PM EDT by telephone at home and verified that I am speaking with the correct person using two identifiers.   I discussed the limitations, risks, security and privacy concerns of performing an evaluation and management service by telephone and the availability of in person appointments. I also discussed with the patient that there may be a patient responsible charge related to this service. The patient expressed understanding and agreed to proceed.  Subjective:  Adrienne Berry is a 29 y.o. G2P1001 at [redacted]w[redacted]d being followed for ongoing prenatal care.  She is currently monitored for the following issues for this low-risk pregnancy and has Pelvic pain; History of cesarean section, low transverse; Supervision of other normal pregnancy, antepartum; Maternal obesity, antepartum; Rh negative, antepartum; Nausea and vomiting during pregnancy; and Hematochezia on their problem list.  Patient reports no complaints. Reports fetal movement. Denies any contractions, bleeding or leaking of fluid.   The following portions of the patient's history were reviewed and updated as appropriate: allergies, current medications, past family history, past medical history, past social history, past surgical history and problem list.   Objective:  There were no vitals filed for this visit.  Babyscripts Data Reviewed: not applicable  General:  Alert, oriented and cooperative.   Mental Status: Normal mood and affect perceived. Normal judgment and thought content.  Rest of physical exam deferred due to type of encounter  Assessment and Plan:  Pregnancy: G2P1001 at [redacted]w[redacted]d 1. History of cesarean section, low transverse Scheduled for rpt c/s  4. Supervision of other normal pregnancy, antepartum Routine care. Pt to let us know home bp  Term labor symptoms and general  obstetric precautions including but not limited to vaginal bleeding, contractions, leaking of fluid and fetal movement were reviewed in detail with the patient.  I discussed the assessment and treatment plan with the patient. The patient was provided an opportunity to ask questions and all were answered. The patient agreed with the plan and demonstrated an understanding of the instructions. The patient was advised to call back or seek an in-person office evaluation/go to MAU at [facility] for any urgent or concerning symptoms. Please refer to After Visit Summary for other counseling recommendations.   I provided 7 minutes of non-face-to-face time during this encounter. The visit was conducted via MyChart-medicine  No follow-ups on file.  Future Appointments  Date Time Provider Department Center  04/21/2020  1:00 PM [place] Bing, MD [facility]  04/25/2020  8:40 AM [facility] 1 [facility] None    [place] Bing, MD [facility] [employer], [facility]

## 2020-04-18 ENCOUNTER — Encounter (HOSPITAL_COMMUNITY): Payer: Self-pay

## 2020-04-18 NOTE — Patient Instructions (Signed)
Adrienne Berry  04/18/2020   Your procedure is scheduled on:  04/27/2020  Arrive at 1:00PM at Entrance C on [address] at [facility]  and [employer]. You are invited to use the FREE valet parking or use the Visitor's parking deck.  Pick up the phone at the desk and dial [phone].  Call this number if you have problems the morning of surgery: [phone]  Remember:   Do not eat food:(After Midnight) Desps de medianoche.  Do not drink clear liquids: (After Midnight) Desps de medianoche.  Take these medicines the morning of surgery with A SIP OF WATER:  none   Do not wear jewelry, make-up or nail polish.  Do not wear lotions, powders, or perfumes. Do not wear deodorant.  Do not shave 48 hours prior to surgery.  Do not bring valuables to the hospital.  [facility] is not   responsible for any belongings or valuables brought to the hospital.  Contacts, dentures or bridgework may not be worn into surgery.  Leave suitcase in the car. After surgery it may be brought to your room.  For patients admitted to the hospital, checkout time is 11:00 AM the day of              discharge.      Please read over the following fact sheets that you were given:     Preparing for Surgery

## 2020-04-21 ENCOUNTER — Ambulatory Visit (INDEPENDENT_AMBULATORY_CARE_PROVIDER_SITE_OTHER): Payer: Medicaid Other | Admitting: Obstetrics and Gynecology

## 2020-04-21 ENCOUNTER — Other Ambulatory Visit: Payer: Self-pay

## 2020-04-21 VITALS — BP 117/78 | HR 97 | Wt 261.0 lb

## 2020-04-21 DIAGNOSIS — O99210 Obesity complicating pregnancy, unspecified trimester: Secondary | ICD-10-CM

## 2020-04-21 DIAGNOSIS — Z6838 Body mass index (BMI) 38.0-38.9, adult: Secondary | ICD-10-CM

## 2020-04-21 DIAGNOSIS — Z3480 Encounter for supervision of other normal pregnancy, unspecified trimester: Secondary | ICD-10-CM

## 2020-04-21 DIAGNOSIS — Z98891 History of uterine scar from previous surgery: Secondary | ICD-10-CM

## 2020-04-21 DIAGNOSIS — O99213 Obesity complicating pregnancy, third trimester: Secondary | ICD-10-CM

## 2020-04-21 DIAGNOSIS — E669 Obesity, unspecified: Secondary | ICD-10-CM

## 2020-04-24 NOTE — Progress Notes (Signed)
Prenatal Visit Note Date: 04/21/2020 Clinic: [facility]  Subjective:  Adrienne Berry is a 29 y.o. G2P1001 at [redacted]w[redacted]d being seen today for ongoing prenatal care.  She is currently monitored for the following issues for this low-risk pregnancy and has Pelvic pain; History of cesarean section, low transverse; Supervision of other normal pregnancy, antepartum; Maternal obesity, antepartum; Rh negative, antepartum; Nausea and vomiting during pregnancy; and Hematochezia on their problem list.  Patient reports no complaints.   Contractions: Irregular. Vag. Bleeding: None.  Movement: Present. Denies leaking of fluid.   The following portions of the patient's history were reviewed and updated as appropriate: allergies, current medications, past family history, past medical history, past social history, past surgical history and problem list. Problem list updated.  Objective:   Vitals:   04/21/20 1357  BP: 117/78  Pulse: 97  Weight: 261 lb (118.4 kg)    Fetal Status: Fetal Heart Rate (bpm): 140 Fundal Height: 38 cm Movement: Present     General:  Alert, oriented and cooperative. Patient is in no acute distress.  Skin: Skin is warm and dry. No rash noted.   Cardiovascular: Normal heart rate noted  Respiratory: Normal respiratory effort, no problems with respiration noted  Abdomen: Soft, gravid, appropriate for gestational age. Pain/Pressure: Present     Pelvic:  Cervical exam deferred        Extremities: Normal range of motion.  Edema: None  Mental Status: Normal mood and affect. Normal behavior. Normal judgment and thought content.   Urinalysis:      Assessment and Plan:  Pregnancy: G2P1001 at [redacted]w[redacted]d  1. Supervision of other normal pregnancy, antepartum Routine care  2. Maternal obesity, antepartum  3. History of cesarean section, low transverse Pt for rpt c-section. Already scheduled.   Term labor symptoms and general obstetric precautions including  but not limited to vaginal bleeding, contractions, leaking of fluid and fetal movement were reviewed in detail with the patient. Please refer to After Visit Summary for other counseling recommendations.  Return in about 2 weeks (around 05/05/2020) for in person, incision check.    Bing, MD

## 2020-04-25 ENCOUNTER — Other Ambulatory Visit: Payer: Self-pay

## 2020-04-25 ENCOUNTER — Other Ambulatory Visit (HOSPITAL_COMMUNITY)
Admission: RE | Admit: 2020-04-25 | Discharge: 2020-04-25 | Disposition: A | Discharge status: Home / Self Care | Payer: Medicaid Other | Source: Ambulatory Visit | Attending: Obstetrics and Gynecology | Admitting: Obstetrics and Gynecology

## 2020-04-25 DIAGNOSIS — Z01812 Encounter for preprocedural laboratory examination: Secondary | ICD-10-CM | POA: Diagnosis present

## 2020-04-25 DIAGNOSIS — Z20822 Contact with and (suspected) exposure to covid-19: Secondary | ICD-10-CM | POA: Insufficient documentation

## 2020-04-25 LAB — ABO/RH: ABO/RH(D): O NEG

## 2020-04-25 LAB — CBC
HCT: 36.4 % (ref 36.0–46.0)
Hemoglobin: 11.7 g/dL — ABNORMAL LOW (ref 12.0–15.0)
MCH: 28.6 pg (ref 26.0–34.0)
MCHC: 32.1 g/dL (ref 30.0–36.0)
MCV: 89 fL (ref 80.0–100.0)
Platelets: 208 10*3/uL (ref 150–400)
RBC: 4.09 MIL/uL (ref 3.87–5.11)
RDW: 14.6 % (ref 11.5–15.5)
WBC: 11.1 10*3/uL — ABNORMAL HIGH (ref 4.0–10.5)
nRBC: 0 % (ref 0.0–0.2)

## 2020-04-25 LAB — TYPE AND SCREEN
ABO/RH(D): O NEG
Antibody Screen: NEGATIVE

## 2020-04-25 LAB — SARS CORONAVIRUS 2 (TAT 6-24 HRS): SARS Coronavirus 2: NEGATIVE

## 2020-04-25 NOTE — MAU Note (Signed)
Asymptomatic, swab collected. Waiting on lab 

## 2020-04-26 LAB — RPR: RPR Ser Ql: NONREACTIVE

## 2020-04-27 ENCOUNTER — Inpatient Hospital Stay (HOSPITAL_COMMUNITY): Payer: Medicaid Other | Admitting: Certified Registered Nurse Anesthetist

## 2020-04-27 ENCOUNTER — Encounter (HOSPITAL_COMMUNITY): Payer: Self-pay | Admitting: Obstetrics and Gynecology

## 2020-04-27 ENCOUNTER — Inpatient Hospital Stay (HOSPITAL_COMMUNITY)
Admission: RE | Admit: 2020-04-27 | Discharge: 2020-04-29 | DRG: 788 | Disposition: A | Discharge status: Home / Self Care | Payer: Medicaid Other | Attending: Obstetrics and Gynecology | Admitting: Obstetrics and Gynecology

## 2020-04-27 ENCOUNTER — Encounter (HOSPITAL_COMMUNITY)
Admission: RE | Disposition: A | Discharge status: Home / Self Care | Payer: Self-pay | Source: Home / Self Care | Attending: Obstetrics and Gynecology

## 2020-04-27 ENCOUNTER — Other Ambulatory Visit: Payer: Self-pay

## 2020-04-27 DIAGNOSIS — Z3A39 39 weeks gestation of pregnancy: Secondary | ICD-10-CM | POA: Diagnosis not present

## 2020-04-27 DIAGNOSIS — O219 Vomiting of pregnancy, unspecified: Secondary | ICD-10-CM | POA: Diagnosis present

## 2020-04-27 DIAGNOSIS — O99210 Obesity complicating pregnancy, unspecified trimester: Secondary | ICD-10-CM | POA: Diagnosis present

## 2020-04-27 DIAGNOSIS — D62 Acute posthemorrhagic anemia: Secondary | ICD-10-CM | POA: Diagnosis not present

## 2020-04-27 DIAGNOSIS — O34211 Maternal care for low transverse scar from previous cesarean delivery: Secondary | ICD-10-CM | POA: Diagnosis present

## 2020-04-27 DIAGNOSIS — Z6791 Unspecified blood type, Rh negative: Secondary | ICD-10-CM | POA: Diagnosis not present

## 2020-04-27 DIAGNOSIS — O9081 Anemia of the puerperium: Secondary | ICD-10-CM | POA: Diagnosis not present

## 2020-04-27 DIAGNOSIS — O26893 Other specified pregnancy related conditions, third trimester: Secondary | ICD-10-CM | POA: Diagnosis present

## 2020-04-27 DIAGNOSIS — Z87891 Personal history of nicotine dependence: Secondary | ICD-10-CM | POA: Diagnosis not present

## 2020-04-27 DIAGNOSIS — E669 Obesity, unspecified: Secondary | ICD-10-CM | POA: Diagnosis present

## 2020-04-27 DIAGNOSIS — O99214 Obesity complicating childbirth: Secondary | ICD-10-CM | POA: Diagnosis present

## 2020-04-27 DIAGNOSIS — Z98891 History of uterine scar from previous surgery: Secondary | ICD-10-CM

## 2020-04-27 LAB — CBC
HCT: 32.4 % — ABNORMAL LOW (ref 36.0–46.0)
Hemoglobin: 10.5 g/dL — ABNORMAL LOW (ref 12.0–15.0)
MCH: 29.1 pg (ref 26.0–34.0)
MCHC: 32.4 g/dL (ref 30.0–36.0)
MCV: 89.8 fL (ref 80.0–100.0)
Platelets: 197 10*3/uL (ref 150–400)
RBC: 3.61 MIL/uL — ABNORMAL LOW (ref 3.87–5.11)
RDW: 14.6 % (ref 11.5–15.5)
WBC: 16 10*3/uL — ABNORMAL HIGH (ref 4.0–10.5)
nRBC: 0 % (ref 0.0–0.2)

## 2020-04-27 LAB — CREATININE, SERUM
Creatinine, Ser: 0.67 mg/dL (ref 0.44–1.00)
GFR calc Af Amer: >60 mL/min (ref >60)
GFR calc non Af Amer: >60 mL/min (ref >60)

## 2020-04-27 SURGERY — Surgical Case
Anesthesia: Spinal | Site: Abdomen | Wound class: Clean Contaminated

## 2020-04-27 MED ORDER — SIMETHICONE 80 MG PO CHEW
80.0000 mg | CHEWABLE_TABLET | Freq: Three times a day (TID) | ORAL | Status: DC
  Administered 2020-04-28 – 2020-04-29 (×5): 80 mg via ORAL
  Filled 2020-04-27 (×5): qty 1

## 2020-04-27 MED ORDER — DIBUCAINE (PERIANAL) 1 % EX OINT: 1.0000 "application " | TOPICAL_OINTMENT | CUTANEOUS | Status: DC | PRN

## 2020-04-27 MED ORDER — DEXTROSE 5 % IV SOLN
3.0000 g | INTRAVENOUS | Status: AC
  Administered 2020-04-27: 3 g via INTRAVENOUS

## 2020-04-27 MED ORDER — ONDANSETRON HCL 4 MG/2ML IJ SOLN: 4.0000 mg | Freq: Once | INTRAMUSCULAR | Status: DC | PRN

## 2020-04-27 MED ORDER — NALBUPHINE HCL 10 MG/ML IJ SOLN: 5.0000 mg | Freq: Once | INTRAMUSCULAR | Status: DC | PRN

## 2020-04-27 MED ORDER — SIMETHICONE 80 MG PO CHEW: 80.0000 mg | CHEWABLE_TABLET | ORAL | Status: DC | PRN

## 2020-04-27 MED ORDER — KETOROLAC TROMETHAMINE 30 MG/ML IJ SOLN
30.0000 mg | Freq: Once | INTRAMUSCULAR | Status: AC | PRN
  Administered 2020-04-27: 30 mg via INTRAVENOUS

## 2020-04-27 MED ORDER — STERILE WATER FOR IRRIGATION IR SOLN
Status: DC | PRN
  Administered 2020-04-27: 1

## 2020-04-27 MED ORDER — SCOPOLAMINE 1 MG/3DAYS TD PT72
1.0000 | MEDICATED_PATCH | Freq: Once | TRANSDERMAL | Status: DC
  Administered 2020-04-27: 1.5 mg via TRANSDERMAL
  Filled 2020-04-27: qty 1

## 2020-04-27 MED ORDER — FENTANYL CITRATE (PF) 100 MCG/2ML IJ SOLN: 25.0000 ug | INTRAMUSCULAR | Status: DC | PRN

## 2020-04-27 MED ORDER — OXYCODONE HCL 5 MG PO TABS: 5.0000 mg | ORAL_TABLET | ORAL | Status: DC | PRN

## 2020-04-27 MED ORDER — ONDANSETRON HCL 4 MG/2ML IJ SOLN: 4.0000 mg | Freq: Three times a day (TID) | INTRAMUSCULAR | Status: DC | PRN

## 2020-04-27 MED ORDER — MORPHINE SULFATE (PF) 0.5 MG/ML IJ SOLN
INTRAMUSCULAR | Status: DC | PRN
  Administered 2020-04-27: .15 mg via INTRATHECAL

## 2020-04-27 MED ORDER — PRENATAL MULTIVITAMIN CH
1.0000 | ORAL_TABLET | Freq: Every day | ORAL | Status: DC
  Administered 2020-04-28 – 2020-04-29 (×2): 1 via ORAL
  Filled 2020-04-27 (×2): qty 1

## 2020-04-27 MED ORDER — NALBUPHINE HCL 10 MG/ML IJ SOLN: 5.0000 mg | INTRAMUSCULAR | Status: DC | PRN

## 2020-04-27 MED ORDER — DEXTROSE 5 % IV SOLN
INTRAVENOUS | Status: AC
  Filled 2020-04-27: qty 3000

## 2020-04-27 MED ORDER — FENTANYL CITRATE (PF) 100 MCG/2ML IJ SOLN
INTRAMUSCULAR | Status: AC
  Filled 2020-04-27: qty 2

## 2020-04-27 MED ORDER — SENNOSIDES-DOCUSATE SODIUM 8.6-50 MG PO TABS
2.0000 | ORAL_TABLET | ORAL | Status: DC
  Administered 2020-04-27 – 2020-04-28 (×2): 2 via ORAL
  Filled 2020-04-27 (×2): qty 2

## 2020-04-27 MED ORDER — DIPHENHYDRAMINE HCL 25 MG PO CAPS: 25.0000 mg | ORAL_CAPSULE | Freq: Four times a day (QID) | ORAL | Status: DC | PRN

## 2020-04-27 MED ORDER — BUPIVACAINE IN DEXTROSE 0.75-8.25 % IT SOLN
INTRATHECAL | Status: DC | PRN
  Administered 2020-04-27: 1.6 mL via INTRATHECAL

## 2020-04-27 MED ORDER — ONDANSETRON HCL 4 MG/2ML IJ SOLN
INTRAMUSCULAR | Status: DC | PRN
  Administered 2020-04-27: 4 mg via INTRAVENOUS

## 2020-04-27 MED ORDER — NALOXONE HCL 0.4 MG/ML IJ SOLN: 0.4000 mg | INTRAMUSCULAR | Status: DC | PRN

## 2020-04-27 MED ORDER — LACTATED RINGERS IV SOLN: INTRAVENOUS | Status: DC

## 2020-04-27 MED ORDER — SODIUM CHLORIDE 0.9 % IV SOLN: INTRAVENOUS | Status: DC | PRN

## 2020-04-27 MED ORDER — SODIUM CHLORIDE 0.9% FLUSH: 3.0000 mL | INTRAVENOUS | Status: DC | PRN

## 2020-04-27 MED ORDER — ENOXAPARIN SODIUM 60 MG/0.6ML ~~LOC~~ SOLN
60.0000 mg | SUBCUTANEOUS | Status: DC
  Administered 2020-04-28 – 2020-04-29 (×2): 60 mg via SUBCUTANEOUS
  Filled 2020-04-27 (×2): qty 0.6

## 2020-04-27 MED ORDER — OXYTOCIN-SODIUM CHLORIDE 30-0.9 UT/500ML-% IV SOLN: 2.5000 [IU]/h | INTRAVENOUS | Status: AC

## 2020-04-27 MED ORDER — DIPHENHYDRAMINE HCL 50 MG/ML IJ SOLN: 12.5000 mg | INTRAMUSCULAR | Status: DC | PRN

## 2020-04-27 MED ORDER — ONDANSETRON HCL 4 MG/2ML IJ SOLN
INTRAMUSCULAR | Status: AC
  Filled 2020-04-27: qty 2

## 2020-04-27 MED ORDER — KETOROLAC TROMETHAMINE 30 MG/ML IJ SOLN
INTRAMUSCULAR | Status: AC
  Filled 2020-04-27: qty 1

## 2020-04-27 MED ORDER — PHENYLEPHRINE HCL-NACL 20-0.9 MG/250ML-% IV SOLN
INTRAVENOUS | Status: AC
  Filled 2020-04-27: qty 250

## 2020-04-27 MED ORDER — OXYCODONE HCL 5 MG PO TABS: 5.0000 mg | ORAL_TABLET | Freq: Once | ORAL | Status: DC | PRN

## 2020-04-27 MED ORDER — MORPHINE SULFATE (PF) 0.5 MG/ML IJ SOLN
INTRAMUSCULAR | Status: AC
  Filled 2020-04-27: qty 10

## 2020-04-27 MED ORDER — NALOXONE HCL 4 MG/10ML IJ SOLN
1.0000 ug/kg/h | INTRAVENOUS | Status: DC | PRN
  Filled 2020-04-27: qty 5

## 2020-04-27 MED ORDER — PHENYLEPHRINE HCL-NACL 20-0.9 MG/250ML-% IV SOLN
INTRAVENOUS | Status: DC | PRN
  Administered 2020-04-27: 60 ug/min via INTRAVENOUS

## 2020-04-27 MED ORDER — WITCH HAZEL-GLYCERIN EX PADS: 1.0000 "application " | MEDICATED_PAD | CUTANEOUS | Status: DC | PRN

## 2020-04-27 MED ORDER — ENOXAPARIN SODIUM 40 MG/0.4ML ~~LOC~~ SOLN: 40.0000 mg | SUBCUTANEOUS | Status: DC

## 2020-04-27 MED ORDER — SIMETHICONE 80 MG PO CHEW
80.0000 mg | CHEWABLE_TABLET | ORAL | Status: DC
  Administered 2020-04-27 – 2020-04-28 (×2): 80 mg via ORAL
  Filled 2020-04-27 (×2): qty 1

## 2020-04-27 MED ORDER — COCONUT OIL OIL
1.0000 "application " | TOPICAL_OIL | Status: DC | PRN
  Administered 2020-04-28: 1 via TOPICAL

## 2020-04-27 MED ORDER — TETANUS-DIPHTH-ACELL PERTUSSIS 5-2.5-18.5 LF-MCG/0.5 IM SUSP: 0.5000 mL | Freq: Once | INTRAMUSCULAR | Status: DC

## 2020-04-27 MED ORDER — OXYTOCIN-SODIUM CHLORIDE 30-0.9 UT/500ML-% IV SOLN
INTRAVENOUS | Status: DC | PRN
  Administered 2020-04-27: 30 mL via INTRAVENOUS

## 2020-04-27 MED ORDER — FENTANYL CITRATE (PF) 100 MCG/2ML IJ SOLN
INTRAMUSCULAR | Status: DC | PRN
  Administered 2020-04-27: 15 ug via INTRATHECAL

## 2020-04-27 MED ORDER — DIPHENHYDRAMINE HCL 25 MG PO CAPS: 25.0000 mg | ORAL_CAPSULE | ORAL | Status: DC | PRN

## 2020-04-27 MED ORDER — SODIUM CHLORIDE 0.9 % IR SOLN
Status: DC | PRN
  Administered 2020-04-27: 1

## 2020-04-27 MED ORDER — MENTHOL 3 MG MT LOZG: 1.0000 | LOZENGE | OROMUCOSAL | Status: DC | PRN

## 2020-04-27 MED ORDER — LACTATED RINGERS IV SOLN: INTRAVENOUS | Status: DC | PRN

## 2020-04-27 MED ORDER — ZOLPIDEM TARTRATE 5 MG PO TABS: 5.0000 mg | ORAL_TABLET | Freq: Every evening | ORAL | Status: DC | PRN

## 2020-04-27 MED ORDER — OXYCODONE HCL 5 MG/5ML PO SOLN: 5.0000 mg | Freq: Once | ORAL | Status: DC | PRN

## 2020-04-27 SURGICAL SUPPLY — 37 items
BENZOIN TINCTURE PRP APPL 2/3 (GAUZE/BANDAGES/DRESSINGS) ×3 IMPLANT
CHLORAPREP W/TINT 26ML (MISCELLANEOUS) ×3 IMPLANT
CLAMP CORD UMBIL (MISCELLANEOUS) IMPLANT
CLOSURE STERI STRIP 1/2 X4 (GAUZE/BANDAGES/DRESSINGS) ×2 IMPLANT
CLOTH BEACON ORANGE TIMEOUT ST (SAFETY) ×3 IMPLANT
DRSG OPSITE POSTOP 4X10 (GAUZE/BANDAGES/DRESSINGS) ×3 IMPLANT
ELECT REM PT RETURN 9FT ADLT (ELECTROSURGICAL) ×3
ELECTRODE REM PT RTRN 9FT ADLT (ELECTROSURGICAL) ×1 IMPLANT
EXTRACTOR VACUUM M CUP 4 TUBE (SUCTIONS) IMPLANT
EXTRACTOR VACUUM M CUP 4' TUBE (SUCTIONS)
GAUZE SPONGE 4X4 12PLY STRL LF (GAUZE/BANDAGES/DRESSINGS) ×4 IMPLANT
GLOVE BIOGEL PI IND STRL 7.0 (GLOVE) ×2 IMPLANT
GLOVE BIOGEL PI IND STRL 7.5 (GLOVE) ×2 IMPLANT
GLOVE BIOGEL PI INDICATOR 7.0 (GLOVE) ×4
GLOVE BIOGEL PI INDICATOR 7.5 (GLOVE) ×4
GLOVE ECLIPSE 7.5 STRL STRAW (GLOVE) ×3 IMPLANT
GOWN STRL REUS W/TWL LRG LVL3 (GOWN DISPOSABLE) ×9 IMPLANT
KIT ABG SYR 3ML LUER SLIP (SYRINGE) IMPLANT
NDL HYPO 25X5/8 SAFETYGLIDE (NEEDLE) IMPLANT
NEEDLE HYPO 25X5/8 SAFETYGLIDE (NEEDLE) IMPLANT
NS IRRIG 1000ML POUR BTL (IV SOLUTION) ×3 IMPLANT
PACK C SECTION WH (CUSTOM PROCEDURE TRAY) ×3 IMPLANT
PAD ABD 7.5X8 STRL (GAUZE/BANDAGES/DRESSINGS) ×2 IMPLANT
PAD OB MATERNITY 4.3X12.25 (PERSONAL CARE ITEMS) ×3 IMPLANT
PENCIL SMOKE EVAC W/HOLSTER (ELECTROSURGICAL) ×3 IMPLANT
RTRCTR C-SECT PINK 25CM LRG (MISCELLANEOUS) ×3 IMPLANT
STRIP CLOSURE SKIN 1/2X4 (GAUZE/BANDAGES/DRESSINGS) ×2 IMPLANT
SUT PLAIN 2 0 XLH (SUTURE) ×2 IMPLANT
SUT VIC AB 0 CT1 36 (SUTURE) ×3 IMPLANT
SUT VIC AB 0 CTX 36 (SUTURE) ×4
SUT VIC AB 0 CTX36XBRD ANBCTRL (SUTURE) ×2 IMPLANT
SUT VIC AB 2-0 CT1 27 (SUTURE) ×2
SUT VIC AB 2-0 CT1 TAPERPNT 27 (SUTURE) ×1 IMPLANT
SUT VIC AB 4-0 KS 27 (SUTURE) ×3 IMPLANT
TOWEL OR 17X24 6PK STRL BLUE (TOWEL DISPOSABLE) ×3 IMPLANT
TRAY FOLEY W/BAG SLVR 14FR LF (SET/KITS/TRAYS/PACK) ×3 IMPLANT
WATER STERILE IRR 1000ML POUR (IV SOLUTION) ×3 IMPLANT

## 2020-04-27 NOTE — Discharge Instructions (Signed)

## 2020-04-27 NOTE — Op Note (Signed)
Operative Note   SURGERY DATE: 04/27/2020  PRE-OP DIAGNOSIS:  *Pregnancy at 39 weeks *H/o Cesarean delivery  POST-OP DIAGNOSIS:  *Pregnancy at 39 weeks *Cesarean delivery, delivered  PROCEDURE: repeat low transverse cesarean section via pfannenstiel skin incision with double layer uterine closure  SURGEON: Surgeon(s) and Role:    * Wouk, Wilfred Curtis, MD - Primary    * Hedda Crumbley L, DO - Fellow  ASSISTANT: None  ANESTHESIA: spinal  ESTIMATED BLOOD LOSS: 209 mL  DRAINS: 150 mL UOP via indwelling foley  TOTAL IV FLUIDS: 1000 mL crystalloid  VTE PROPHYLAXIS: SCDs to bilateral lower extremities  ANTIBIOTICS: Two grams of Cefazolin were given, within 1 hour of skin incision  SPECIMENS: None  COMPLICATIONS: None  INDICATIONS: Repeat elective, history of Cesarean delivery  FINDINGS: No intra-abdominal adhesions were noted. Grossly normal uterus, tubes and ovaries. Clear amniotic fluid, cephalic female infant, weight 3040 gm, APGARs 9/9, intact placenta.  PROCEDURE IN DETAIL: The patient was taken to the operating room where anesthesia was administered and normal fetal heart tones were confirmed. She was then prepped and draped in the normal fashion in the dorsal supine position with a leftward tilt.  After a time out was performed, a pfannensteil skin incision was made with the scalpel and carried through to the underlying layer of fascia. The fascia was then incised at the midline and this incision was extended laterally with the mayo scissors. Attention was turned to the superior aspect of the fascial incision which was grasped with the kocher clamps x 2, tented up and the rectus muscles were dissected off bluntly and sharply. In a similar fashion the inferior aspect of the fascial incision was grasped with the kocher clamps, tented up and the rectus muscles dissected off with the mayo scissors. The rectus muscles were then separated in the midline and the peritoneum was entered  bluntly. The Alexis retractor was inserted and the vesicouterine peritoneum was identified.  A low transverse hysterotomy was made with the scalpel until the endometrial cavity was breached and the amniotic sac ruptured, yielding clear amniotic fluid. This incision was extended bluntly and the infant's head, shoulders and body were delivered atraumatically.The cord was clamped x 2 and cut, and the infant was handed to the awaiting pediatricians, after delayed cord clamping was done.  The placenta was then gradually expressed from the uterus and then the uterus was cleared of all clots and debris. The hysterotomy was repaired with a running suture of 0 Vicryl. A second imbricating layer of 0 Vicryl suture was then placed achieving excellent hemostasis.   The hysterotomy and all operative sites were reinspected and excellent hemostasis was noted after irrigation and suction of the abdomen with warm saline.  The peritoneum was closed with a running stitch of 3-0 Vicryl. The fascia was reapproximated with 0 Vicryl in a simple running fashion bilaterally. The subcutaneous layer was then reapproximated with a running suture of 2-0 plain gut, and the skin was then closed with 4-0 Vicryl, in a subcuticular fashion.  The patient  tolerated the procedure well. Sponge, lap, needle, and instrument counts were correct x 2. The patient was transferred to the recovery room awake, alert and breathing independently in stable condition.  Marlowe Alt, DO OB Fellow [facility] [employer] [facility])

## 2020-04-27 NOTE — H&P (Signed)
LABOR AND DELIVERY ADMISSION HISTORY AND PHYSICAL NOTE  Adrienne Berry is a 29 y.o. female G2P1001 with IUP at 60w1dby 7 wk u/s presenting for scheduled repeat cesarean section.   She reports positive fetal movement. She denies leakage of fluid or vaginal bleeding.  Prenatal History/Complications:  Past Medical History: Past Medical History:  Diagnosis Date  . Abnormal uterine bleeding 08/19/2019  . Anemia     Past Surgical History: Past Surgical History:  Procedure Laterality Date  . CESAREAN SECTION      Obstetrical History: OB History    Gravida  2   Para  1   Term  1   Preterm      AB      Living  1     SAB      TAB      Ectopic      Multiple      Live Births  1           Social History: Social History   Socioeconomic History  . Marital status: Married    Spouse name: Not on file  . Number of children: Not on file  . Years of education: Not on file  . Highest education level: Not on file  Occupational History  . Not on file  Tobacco Use  . Smoking status: Former Smoker    Packs/day: 0.50    Years: 12.00    Pack years: 6.00  . Smokeless tobacco: Never Used  Substance and Sexual Activity  . Alcohol use: Not Currently  . Drug use: Never  . Sexual activity: Yes    Partners: Male    Birth control/protection: None  Other Topics Concern  . Not on file  Social History Narrative  . Not on file   Social Determinants of Health   Financial Resource Strain:   . Difficulty of Paying Living Expenses:   Food Insecurity:   . Worried About RCharity fundraiserin the Last Year:   . RArboriculturistin the Last Year:   Transportation Needs:   . LFilm/video editor(Medical):   .Marland KitchenLack of Transportation (Non-Medical):   Physical Activity:   . Days of Exercise per Week:   . Minutes of Exercise per Session:   Stress:   . Feeling of Stress :   Social Connections:   . Frequency of Communication with Friends and Family:   . Frequency of  Social Gatherings with Friends and Family:   . Attends Religious Services:   . Active Member of Clubs or Organizations:   . Attends CArchivistMeetings:   .Marland KitchenMarital Status:     Family History: Family History  Problem Relation Age of Onset  . Ovarian cysts Mother   . Diabetes Mother   . Ovarian cysts Maternal Aunt     Allergies: No Known Allergies  Medications Prior to Admission  Medication Sig Dispense Refill Last Dose  . ferrous gluconate (FERGON) 324 MG tablet Take 1 tablet (324 mg total) by mouth daily with breakfast. 60 tablet 1 04/26/2020 at Unknown time  . prenatal vitamin w/FE, FA (NATACHEW) 29-1 MG CHEW chewable tablet Chew 1 tablet by mouth daily at 12 noon. Taking Prenatal Vitamin with iron gummies.   04/26/2020 at Unknown time  . Blood Pressure Monitor KIT Please check blood pressure 1-2 times per week 1 kit 0      Review of Systems   All systems reviewed and negative except as stated in HPI  Blood pressure 136/82, pulse (!) 107, temperature 98.5 F (36.9 C), resp. rate 20, height 6' (1.829 m), weight 118.4 kg, last menstrual period 07/28/2019, SpO2 97 %. General appearance: alert, cooperative and appears stated age Lungs: clear to auscultation bilaterally Heart: regular rate and rhythm Abdomen: soft, non-tender; bowel sounds normal Extremities: No calf swelling or tenderness Presentation: cephalic Fetal monitoring: 152/min Uterine activity: quiet     Prenatal labs: ABO, Rh: --/--/O NEG, O NEG Performed at [facility] [address], [place], [place] [address]  [phone]) Antibody: NEG (05/31 9906) Rubella: 4.34 (10/14 1151) RPR: NON REACTIVE (05/31 0936)  HBsAg: Negative (10/14 1151)  HIV: Non Reactive (03/18 0837)  GBS: Negative/-- (05/13 0958)  2 hr Glucola: wnl Genetic screening:  wnl Anatomy US: wnl  Prenatal Transfer Tool  Maternal Diabetes: No Genetic Screening: Normal Maternal Ultrasounds/Referrals: Normal Fetal  Ultrasounds or other Referrals:  None Maternal Substance Abuse:  No Significant Maternal Medications:  None Significant Maternal Lab Results: Group B Strep negative  No results found for this or any previous visit (from the past 24 hour(s)).  Patient Active Problem List   Diagnosis Date Noted  . History of cesarean delivery 04/27/2020  . Hematochezia 10/14/2019  . Nausea and vomiting during pregnancy 09/21/2019  . Rh negative, antepartum 09/18/2019  . History of cesarean section, low transverse 09/09/2019  . Supervision of other normal pregnancy, antepartum 09/09/2019  . Maternal obesity, antepartum 09/09/2019  . Pelvic pain 08/19/2019    Assessment: Adrienne Berry is a 29 y.o. G2P1001 at 53w1dhere for elective repeat cesarean section.  The risks of cesarean section were discussed with the patient including but were not limited to: bleeding which may require transfusion or reoperation; infection which may require antibiotics; injury to bowel, bladder, ureters or other surrounding organs; injury to the fetus; need for additional procedures including hysterectomy in the event of a life-threatening hemorrhage; placental abnormalities wth subsequent pregnancies, incisional problems, thromboembolic phenomenon and other postoperative/anesthesia complications. In light of her possible desire for future pregnancies, I did highlight the risks inherent in multiple repeat cesarean sections. The patient concurred with the proposed plan, giving informed written consent for the procedures.  Patient has been NPO since midnight and she will remain NPO for procedure. Anesthesia and OR aware.  Preoperative prophylactic antibiotics and SCDs ordered on call to the OR.  To OR when ready.  #Rh neg: w/u postpartum #ID:  gbs neg #MOF: breast #MOC: unsure (possibly vasectomy) #Circ:  Yes, inpt  Donzel Romack B [place] 04/27/2020, 2:21 PM

## 2020-04-27 NOTE — Transfer of Care (Signed)
Immediate Anesthesia Transfer of Care Note  Patient: Adrienne Berry  Procedure(s) Performed: CESAREAN SECTION (N/A Abdomen)  Patient Location: PACU  Anesthesia Type:Spinal  Level of Consciousness: awake  Airway & Oxygen Therapy: Patient Spontanous Breathing  Post-op Assessment: Report given to RN and Post -op Vital signs reviewed and stable  Post vital signs: Reviewed and stable  Last Vitals:  Vitals Value Taken Time  BP 107/51 04/27/20 1548  Temp    Pulse 102 04/27/20 1551  Resp 18 04/27/20 1551  SpO2 100 % 04/27/20 1551  Vitals shown include unvalidated device data.  Last Pain: There were no vitals filed for this visit.       Complications: No apparent anesthesia complications

## 2020-04-27 NOTE — Anesthesia Preprocedure Evaluation (Signed)
Anesthesia Evaluation  Patient identified by MRN, date of birth, ID band Patient awake    Reviewed: Allergy & Precautions, H&P , NPO status , Patient's Chart, lab work & pertinent test results  History of Anesthesia Complications Negative for: history of anesthetic complications  Airway Mallampati: II  TM Distance: >3 FB Neck ROM: full    Dental no notable dental hx.    Pulmonary neg pulmonary ROS, former smoker,    Pulmonary exam normal        Cardiovascular negative cardio ROS Normal cardiovascular exam     Neuro/Psych negative neurological ROS  negative psych ROS   GI/Hepatic negative GI ROS, Neg liver ROS,   Endo/Other  negative endocrine ROS  Renal/GU negative Renal ROS  negative genitourinary   Musculoskeletal   Abdominal   Peds  Hematology negative hematology ROS (+)   Anesthesia Other Findings   Reproductive/Obstetrics (+) Pregnancy                             Anesthesia Physical Anesthesia Plan  ASA: II  Anesthesia Plan: Spinal   Post-op Pain Management:    Induction:   PONV Risk Score and Plan: Ondansetron and Treatment may vary due to age or medical condition  Airway Management Planned:   Additional Equipment:   Intra-op Plan:   Post-operative Plan:   Informed Consent: I have reviewed the patients History and Physical, chart, labs and discussed the procedure including the risks, benefits and alternatives for the proposed anesthesia with the patient or authorized representative who has indicated his/her understanding and acceptance.       Plan Discussed with:   Anesthesia Plan Comments:         Anesthesia Quick Evaluation  

## 2020-04-27 NOTE — Discharge Summary (Addendum)
Postpartum Discharge Summary     Patient Name: Adrienne Berry DOB: Jul 16, 1991 MRN: [id number]  Date of admission: 04/27/2020 Delivery date:04/27/2020  Delivering provider: Laurey Arrow BEDFORD  Date of discharge: 04/29/2020  Admitting diagnosis: History of cesarean delivery [Z98.891] Intrauterine pregnancy: [redacted]w[redacted]d    Secondary diagnosis:  Principal Problem:   History of cesarean delivery Active Problems:   History of cesarean section, low transverse   Maternal obesity, antepartum   Rh negative, antepartum   Nausea and vomiting during pregnancy  Additional problems: None    Discharge diagnosis: Term Pregnancy Delivered                                              Post partum procedures: None Augmentation: N/A Complications: None  Hospital course: Sceduled C/S   29y.o. yo G2P1001 at 37w1das admitted to the hospital 04/27/2020 for scheduled cesarean section with the following indication:Elective Repeat.Delivery details are as follows:  Membrane Rupture Time/Date: 3:01 PM ,04/27/2020   Delivery Method:C-Section, Low Transverse  Details of operation can be found in separate operative note.  Patient had an uncomplicated postpartum course.  She is ambulating, tolerating a regular diet, passing flatus, and urinating well. Patient is discharged home in stable condition on  04/27/20        Newborn Data: Birth date:04/27/2020  Birth time:3:01 PM  Gender:Female  Living status:Living  Apgars:9 ,9  Weight:3040 g     Magnesium Sulfate received: No BMZ received: No Rhophylac:No MMR:N/A T-DaP:Given prenatally Flu: N/A Transfusion:No  Physical exam  Vitals:   04/27/20 1312 04/27/20 1547 04/27/20 1600  BP: 136/82 (!) 107/51 (!) 116/56  Pulse: (!) 107 89 94  Resp: _0 Temp: 98.5 F (36.9 C) (!) 97.5 F (36.4 C)   TempSrc:  Oral   SpO2: 97% 100% 100%  Weight: 118.4 kg    Height: 6' (1.829 m)     General: alert, cooperative and no distress Lochia: appropriate Uterine  Fundus: firm Incision: Healing well with no significant drainage, No significant erythema DVT Evaluation: No evidence of DVT seen on physical exam. Labs: Lab Results  Component Value Date   WBC 11.1 (H) 04/25/2020   HGB 11.7 (L) 04/25/2020   HCT 36.4 04/25/2020   MCV 89.0 04/25/2020   PLT 208 04/25/2020   CMP Latest Ref Rng & Units 10/15/2019  Glucose 70 - 99 mg/dL 78  BUN 6 - 20 mg/dL 5(L)  Creatinine 0.44 - 1.00 mg/dL 0.55  Sodium 135 - 145 mmol/L 135  Potassium 3.5 - 5.1 mmol/L 3.8  Chloride 98 - 111 mmol/L 105  CO2 22 - 32 mmol/L 23  Calcium 8.9 - 10.3 mg/dL 8.3(L)  Total Protein 6.5 - 8.1 g/dL -  Total Bilirubin 0.3 - 1.2 mg/dL -  Alkaline Phos 38 - 126 U/L -  AST 15 - 41 U/L -  ALT 0 - 44 U/L -   Edinburgh Score: No flowsheet data found.   After visit meds:  Allergies as of 04/29/2020   No Known Allergies      Medication List     STOP taking these medications    Blood Pressure Monitor Kit   ferrous gluconate 324 MG tablet Commonly known as: FERGON       TAKE these medications    ibuprofen 600 MG tablet Commonly known as: ADVIL Take 1 tablet (  600 mg total) by mouth every 6 (six) hours as needed.   oxyCODONE-acetaminophen 5-325 MG tablet Commonly known as: Percocet Take 1 tablet by mouth every 6 (six) hours as needed for severe pain.   prenatal vitamin w/FE, FA 29-1 MG Chew chewable tablet Chew 1 tablet by mouth daily at 12 noon. Taking Prenatal Vitamin with iron gummies.         Discharge home in stable condition Infant Feeding: Breast Infant Disposition:home with mother Discharge instruction: per After Visit Summary and Postpartum booklet. Activity: Advance as tolerated. Pelvic rest for 6 weeks.  Diet: routine diet Future Appointments:No future appointments. Follow up Visit:    Please schedule this patient for an In person postpartum visit in 4 weeks with the following provider: Any provider. Additional Postpartum F/U:Incision check 1  week  High risk pregnancy complicated by:  H/o CS, maternal obesity Delivery mode:  C-Section, Low Transverse  Anticipated Birth Control:  Unsure, ?Vasectomy   EMILY Madelin Headings, MD PGY-2 Resident Family Medicine 04/29/2020, 11:33 AM  I confirm that I have verified the information documented in the resident's note and that I have also personally performed the history, physical exam and all medical decision making activities.  I have verified that all services and findings are accurately documented in this student's note; and I agree with management and plan as outlined in the documentation. I have also made any necessary editorial changes.   Fatima Blank, [place] for [employer], [employer] 04/29/2020 7:46 PM

## 2020-04-27 NOTE — Anesthesia Procedure Notes (Signed)
Spinal  Patient location during procedure: OR Staffing Performed: anesthesiologist  Anesthesiologist: Amour Cutrone E, MD Preanesthetic Checklist Completed: patient identified, IV checked, risks and benefits discussed, surgical consent, monitors and equipment checked, pre-op evaluation and timeout performed Spinal Block Patient position: sitting Prep: DuraPrep and site prepped and draped Patient monitoring: continuous pulse ox, blood pressure and heart rate Approach: midline Location: L3-4 Injection technique: single-shot Needle Needle type: Pencan  Needle gauge: 24 G Needle length: 9 cm Additional Notes Functioning IV was confirmed and monitors were applied. Sterile prep and drape, including hand hygiene and sterile gloves were used. The patient was positioned and the spine was prepped. The skin was anesthetized with lidocaine.  Free flow of clear CSF was obtained prior to injecting local anesthetic into the CSF. The needle was carefully withdrawn. The patient tolerated the procedure well.      

## 2020-04-28 ENCOUNTER — Encounter: Payer: Self-pay | Admitting: *Deleted

## 2020-04-28 LAB — CBC
HCT: 28.5 % — ABNORMAL LOW (ref 36.0–46.0)
Hemoglobin: 9.3 g/dL — ABNORMAL LOW (ref 12.0–15.0)
MCH: 29.4 pg (ref 26.0–34.0)
MCHC: 32.6 g/dL (ref 30.0–36.0)
MCV: 90.2 fL (ref 80.0–100.0)
Platelets: 170 10*3/uL (ref 150–400)
RBC: 3.16 MIL/uL — ABNORMAL LOW (ref 3.87–5.11)
RDW: 14.7 % (ref 11.5–15.5)
WBC: 11.2 10*3/uL — ABNORMAL HIGH (ref 4.0–10.5)
nRBC: 0 % (ref 0.0–0.2)

## 2020-04-28 MED ORDER — IBUPROFEN 800 MG PO TABS
800.0000 mg | ORAL_TABLET | Freq: Three times a day (TID) | ORAL | Status: DC
  Administered 2020-04-28 – 2020-04-29 (×5): 800 mg via ORAL
  Filled 2020-04-28 (×5): qty 1

## 2020-04-28 MED ORDER — ACETAMINOPHEN 325 MG PO TABS
650.0000 mg | ORAL_TABLET | Freq: Four times a day (QID) | ORAL | Status: DC | PRN
  Administered 2020-04-28 (×2): 650 mg via ORAL
  Filled 2020-04-28 (×2): qty 2

## 2020-04-28 NOTE — Anesthesia Postprocedure Evaluation (Signed)
Anesthesia Post Note  Patient: Adrienne Berry  Procedure(s) Performed: CESAREAN SECTION (N/A Abdomen)     Patient location during evaluation: PACU Anesthesia Type: Spinal Level of consciousness: oriented and awake and alert Pain management: pain level controlled Vital Signs Assessment: post-procedure vital signs reviewed and stable Respiratory status: spontaneous breathing, respiratory function stable and nonlabored ventilation Cardiovascular status: blood pressure returned to baseline and stable Postop Assessment: no headache, no backache, no apparent nausea or vomiting and spinal receding Anesthetic complications: no    Last Vitals:  Vitals:   04/28/20 0340 04/28/20 0816  BP: 110/67 117/64  Pulse: 78 66  Resp: 16 17  Temp: 36.9 C 36.8 C  SpO2: 100% 100%    Last Pain:  Vitals:   04/28/20 0816  TempSrc: Oral  PainSc:                  [name]

## 2020-04-28 NOTE — Progress Notes (Signed)
Post Partum Day 1  Subjective:  Adrienne Berry is a 29 y.o. G2P1001 [redacted]w[redacted]d s/p elective rLTCS.  No acute events overnight.  Pt denies problems with ambulating, voiding or po intake.  She denies nausea or vomiting.  Pain is well controlled.  She has had flatus. She has not had bowel movement.  Lochia Small.  Plan for birth control is vasectomy.  Method of Feeding: breast  Objective: BP 117/64 (BP Location: Right Arm)    Pulse 66    Temp 98.3 F (36.8 C) (Oral)    Resp 17    Ht 6' (1.829 m)    Wt 118.4 kg    LMP 07/28/2019 (Exact Date) Comment: Also normal menses beginning 07/13/2019 x6 days.   SpO2 100%    BMI 35.40 kg/m   Physical Exam:  General: alert, cooperative and no distress Lochia:normal flow Chest: CTAB Heart: RRR no m/r/g Abdomen: +BS, soft, tender during fundus exam, fundus firm at/below umbilicus, incision dressing is dry and intact no surrounding warmth or erythema Uterine Fundus: firm DVT Evaluation: No evidence of DVT seen on physical exam. Extremities: no LE edema  Recent Labs    04/27/20 1901 04/28/20 0727  HGB 10.5* 9.3*  HCT 32.4* 28.5*    Assessment/Plan:  ASSESSMENT: Adrienne Berry is a 29 y.o. G2P1001 [redacted]w[redacted]d ppd/POD #1 s/p elective repeat low transverse c-section, doing well.   O neg: antibody neg, Rhogam, infant also O neg   Anemia: hbg dropped from 10.5 to 9.3, pt asymptomatic, will check hgb in AM   Circumcision prior to discharge   LOS: 1 day   Adrienne Berry 04/28/2020, 8:22 AM

## 2020-04-28 NOTE — Lactation Note (Addendum)
This note was copied from a baby's chart. Lactation Consultation Note  Patient Name: Adrienne Berry [name] Date: 04/28/2020 Reason for consult: Initial assessment;1st time breastfeeding;Term P2, 10 hour term female infant. Mom hx: C/S delivery and obesity. Per mom. She has  personal DEBP that she has brought with her to the Hospital. Per mom, she did not breastfeed her 1st child. Per mom, she feels infant is latching well at breast, she BF 5 times since delivery infant is latching 7 to 15 minutes most feedings. Per mom, she is only feeling tug with latch,  no pain or discomfort. LC did not observe latch at this time, mom had finished breastfeeding infant for  15 minutes, prior to [facility] entering the room. Infant asleep in basinet.  Mom knows to breastfeed infant according to hunger cues, 8 to 12+ times within 24 hours and not exceed 3 hours without BF infant. Mom knows to call RN or LC if she has questions, concerns or need assistance with latching infant at breast.  Parent will continue to do STS as much as possible with infant. Mom made aware of O/P services, breastfeeding support groups, community resources, and our phone # for post-discharge questions.  Maternal Data Formula Feeding for Exclusion: No Has patient been taught Hand Expression?: Yes Does the patient have breastfeeding experience prior to this delivery?: No  Feeding Feeding Type: Breast Fed  LATCH Score                   Interventions Interventions: Breast feeding basics reviewed;Breast compression;Hand express;Expressed milk;DEBP;Skin to skin  Lactation Tools Discussed/Used WIC Program: No   Consult Status Consult Status: Follow-up Date: 04/28/20 Follow-up type: In-patient    [name] 04/28/2020, 1:17 AM

## 2020-04-28 NOTE — Lactation Note (Signed)
This note was copied from a baby's chart. Lactation Consultation Note  Patient Name: Adrienne Berry Date: 04/28/2020 Reason for consult: Follow-up assessment P2, 31 hour female term female infant. Infant had 5 voids and 3 stools per mom, he had two stools during his circumcision. Dad was concern about infant frequently wanting to breastfed, mom unsure if she has breast milk to give infant. LC discussed with parents that infant is currently cluster feeding which is normal for the 2nd day of life. LC discussed hand expression and mom saw she has colostrum infant was given 2 mls of colostrum by spoon and started cuing to breastfeed.  Per mom, she has been having some pain with latches and infant doesn't stay awake long at the breast. LC assisted mom with latching, LC asked mom to hand express a small amount of colostrum out of breast prior to latching infant which help evert mom nipple shaft out more due to mom having pseudo inverted nipple that responds well to stimulation. Mom latched infant on her right breast using the football hold position,infant's latched  with a wide mouth, nose and chin touching breast, swallows heard and observed, mom was still breastfeeding after 10 minutes when LC left the room. LC discussed tips to keep infant awake to breastfeed for longer duration at breast than 10 minutes, parents were pleased with how well infant was feeding at breast. Per mom, this was her first time trying the football hold position and she feels she will use it more. Mom knows if she feels any pain or discomfort and not a tug when infant is latch to re-latch infant at breast and ask RN or LC for assistance with latching infant at breast if needed. Mom will continue to breastfeed infant on demand, by cues and not exceed 3 hours without breastfeeding infant.  LC alert RN to give mom coconut oil to help with sore nipples.  Maternal Data    Feeding Feeding Type: Breast Fed  LATCH  Score Latch: Grasps breast easily, tongue down, lips flanged, rhythmical sucking.  Audible Swallowing: Spontaneous and intermittent  Type of Nipple: Inverted(dimpling)  Comfort (Breast/Nipple): Soft / non-tender  Hold (Positioning): Assistance needed to correctly position infant at breast and maintain latch.  LATCH Score: 7  Interventions Interventions: Breast feeding basics reviewed;Breast compression;Assisted with latch;Adjust position;Skin to skin;Support pillows;Breast massage;Position options;Hand express;Expressed milk  Lactation Tools Discussed/Used     Consult Status Consult Status: Follow-up Date: 04/29/20 Follow-up type: In-patient    Adrienne Berry 04/28/2020, 10:04 PM

## 2020-04-29 ENCOUNTER — Encounter (HOSPITAL_COMMUNITY): Payer: Self-pay | Admitting: *Deleted

## 2020-04-29 MED ORDER — IBUPROFEN 600 MG PO TABS
600.0000 mg | ORAL_TABLET | Freq: Four times a day (QID) | ORAL | 1 refills | Status: DC | PRN
Start: 2020-04-29 — End: 2021-08-13

## 2020-04-29 MED ORDER — OXYCODONE-ACETAMINOPHEN 5-325 MG PO TABS: 1.0000 | ORAL_TABLET | Freq: Four times a day (QID) | ORAL | 0 refills | Status: AC | PRN

## 2020-04-29 NOTE — Lactation Note (Signed)
This note was copied from a baby's chart. Lactation Consultation Note  Patient Name: Adrienne Berry [id number] Date: 04/29/2020 Reason for consult: Follow-up assessment;1st time breastfeeding  Infant is 22 hrs old & at 8% weight loss with this morning's weight (infant had had 8 voids, 4 stools, & 2 large emesis [mucous] between being weighed at birth & his weight this morning).   Mom verbalized that she was upset when I entered the room & asked how she was doing. Mom soon became tearful; she said that had thought breastfeeding was going well, but then felt she had been doing "something wrong" & had even thought about quitting breastfeeding after speaking to the pediatrician (Mom did not nurse her 1st child).  I reassured Mom & offered to listen to her. Mom declined to speak about it any more, but said that she was going to continue to breastfeed infant & to would offer formula after feeding at the breast until her milk comes to volume. I noted that there was a bottle of formula in infant's bassinet that had been drunk from.  I clarified that Mom knows how to reach [place] post-discharge for any questions or concerns & she affirmed that she did.   Lurline Hare [facility] 04/29/2020, 1:41 PM

## 2020-05-04 ENCOUNTER — Other Ambulatory Visit: Payer: Self-pay

## 2020-05-04 ENCOUNTER — Ambulatory Visit (INDEPENDENT_AMBULATORY_CARE_PROVIDER_SITE_OTHER): Payer: Medicaid Other | Admitting: *Deleted

## 2020-05-04 VITALS — BP 127/83 | HR 93 | Wt 260.0 lb

## 2020-05-04 DIAGNOSIS — Z98891 History of uterine scar from previous surgery: Secondary | ICD-10-CM

## 2020-05-04 NOTE — Progress Notes (Signed)
Pt here today for an incision check from her repeat cesarean section on 04/27/2020. Pt denies any issues at this time.   Pt removed honeycomb at home last night. Incision healing well, no redness or drainage noted. Applied 3 steri strips to right side of incision.   Wound care instructions given. Pt to follow up as needed and or at postpartum visit.

## 2020-05-09 NOTE — Progress Notes (Signed)
Patient was assessed and managed by nursing staff during this encounter. I have reviewed the chart and agree with the documentation and plan. I have also made any necessary editorial changes.  Elroy Bing, MD 05/09/2020 2:14 PM

## 2020-05-24 ENCOUNTER — Ambulatory Visit: Payer: Medicaid Other | Admitting: Family Medicine

## 2020-06-02 ENCOUNTER — Telehealth: Payer: Self-pay | Admitting: Lactation Services

## 2020-06-02 ENCOUNTER — Other Ambulatory Visit: Payer: Self-pay

## 2020-06-02 ENCOUNTER — Ambulatory Visit (INDEPENDENT_AMBULATORY_CARE_PROVIDER_SITE_OTHER): Payer: Medicaid Other | Admitting: Obstetrics and Gynecology

## 2020-06-02 MED ORDER — NORETHIN ACE-ETH ESTRAD-FE 1-20 MG-MCG(24) PO CAPS: 1.0000 | ORAL_CAPSULE | Freq: Every day | ORAL | 3 refills | Status: AC

## 2020-06-02 NOTE — Telephone Encounter (Signed)
Center for [facility] contacted Lactation department. Provider asks that a follow-up be conducted with mom and baby re: questions about supply, baby's intake, and breast and bottle feeding. LC on duty, Adrienne Berry, reached out to Adrienne Berry. Adrienne Berry, thankful for the contact, reports that she has spoken with someone for advice and assistance and has an appointment set for Monday 7/12. LC asked if it was Adrienne Berry with another Berry or private, and she provided information for her appointment to be with breastfeeding support through Adrienne Berry, Adrienne Berry. Adrienne Berry encouraged to call in to lactation here at [facility] with any questions/concerns following her appointment if needed.

## 2020-06-02 NOTE — Progress Notes (Signed)
    Post Partum Visit Note  Adrienne Berry is a 29 y.o. G2P1001 who presents for a postpartum visit. She is 5 weeks postpartum following a repeat cesarean section.  I have fully reviewed the prenatal and intrapartum course. The delivery was at 39.0 gestational weeks.  Anesthesia: spinal. Postpartum course has been uncomplicated. Baby is doing well, no issues. Baby is feeding by both breast and bottle - Adrienne Berry. Bleeding no bleeding. Bowel function is normal. Bladder function is normal. Patient is sexually active. Contraception method is OCP (estrogen/progesterone). Postpartum depression screening: negative.   Review of Systems Pertinent items are noted in HPI.    Objective:  Blood pressure 124/82, pulse 79, weight 259 lb (117.5 kg), unknown if currently breastfeeding. NAD Abdomen: soft, nttp, nd. C/d/i incision with one small suture seen and cut and removed about 4cm from the right lateral edge Assessment:    normal postpartum exam. Pap smear not done at today's visit. (normal 2020)  Plan:  Routine care. Patient is doing well. Has used OCPs before and would like to go back on it. Taytulla sent in and recommend okay for sex in 7 days. Lactation consult made for ? Infant not getting enough intake.   [place] Bing, MD [facility] [employer]

## 2020-08-14 IMAGING — US US ABDOMEN LIMITED
1 series · 14 of 25 positions shown · non-contrast
Comparison: None.

CLINICAL DATA: Elevated LFT

EXAM:
ULTRASOUND ABDOMEN LIMITED RIGHT UPPER QUADRANT

[Series 1: us abdomen limited · 14 of 42 slices shown]
[im 1/42]
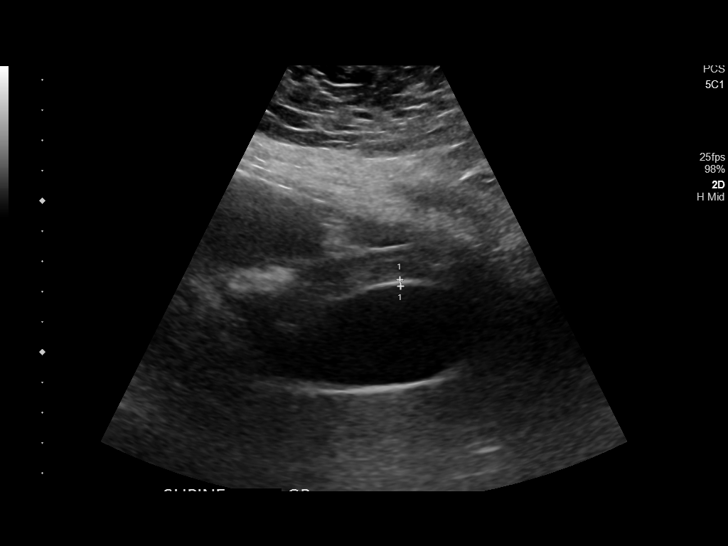
[im 4/42]
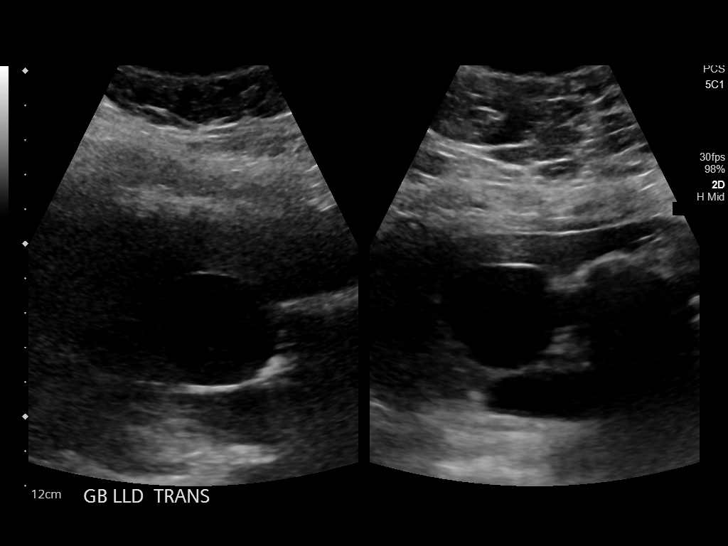
[im 7/42]
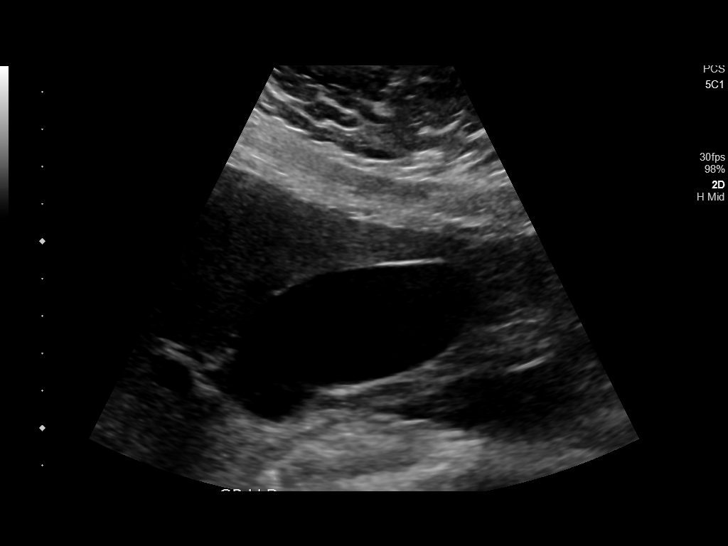
[im 11/42]
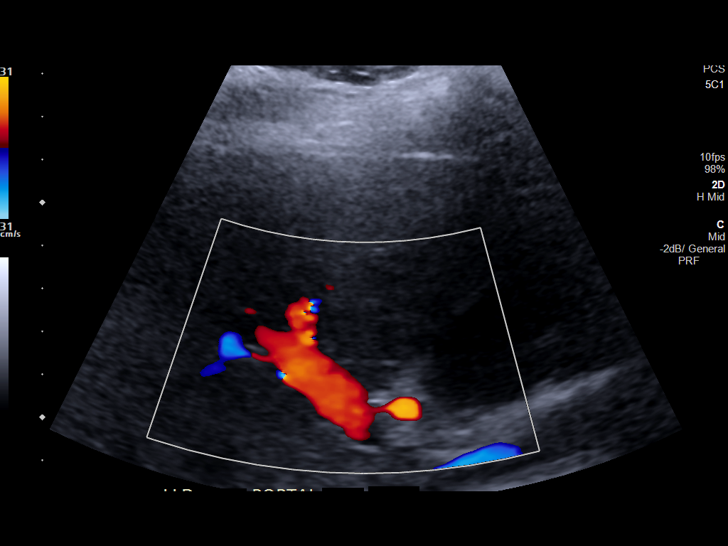
[im 14/42]
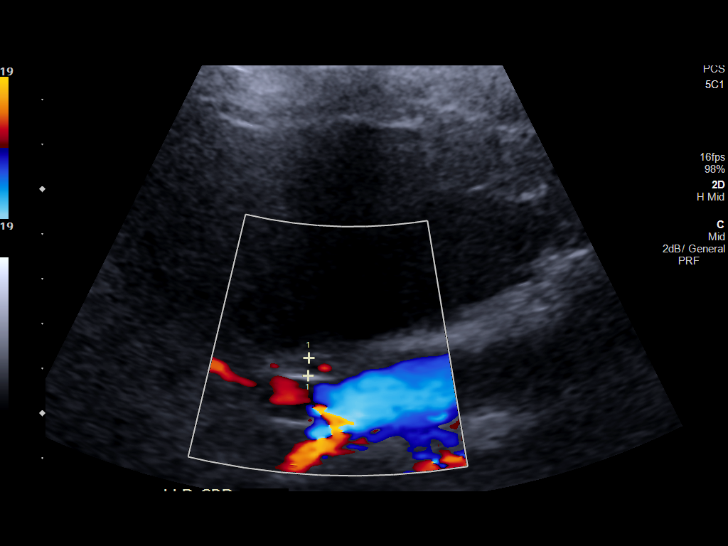
[im 16/42]
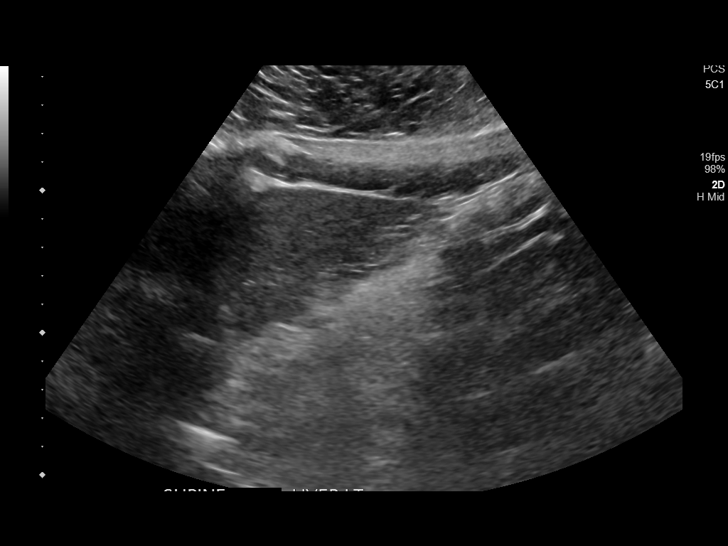
[im 19/42]
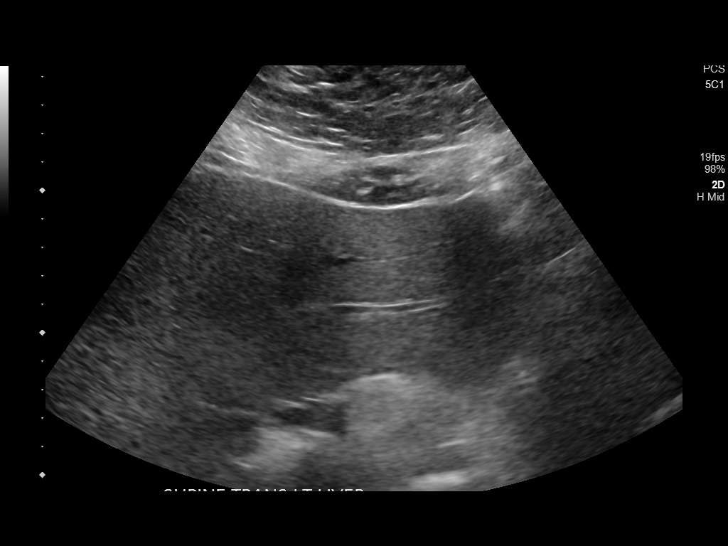
[im 23/42]
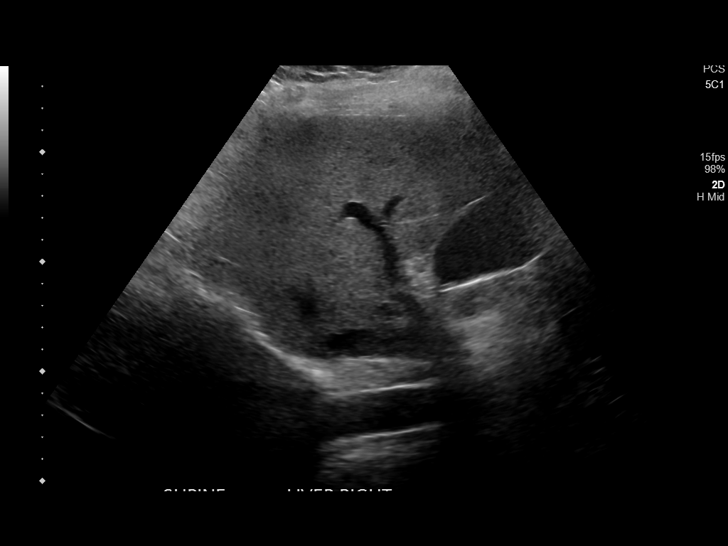
[im 26/42]
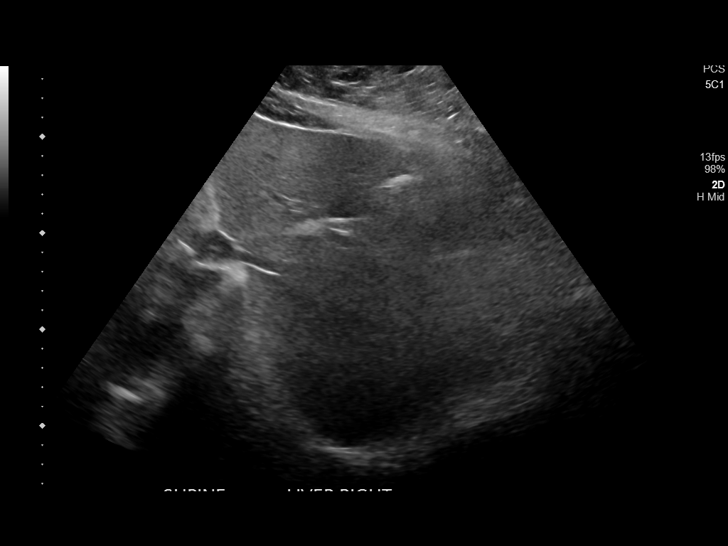
[im 28/42]
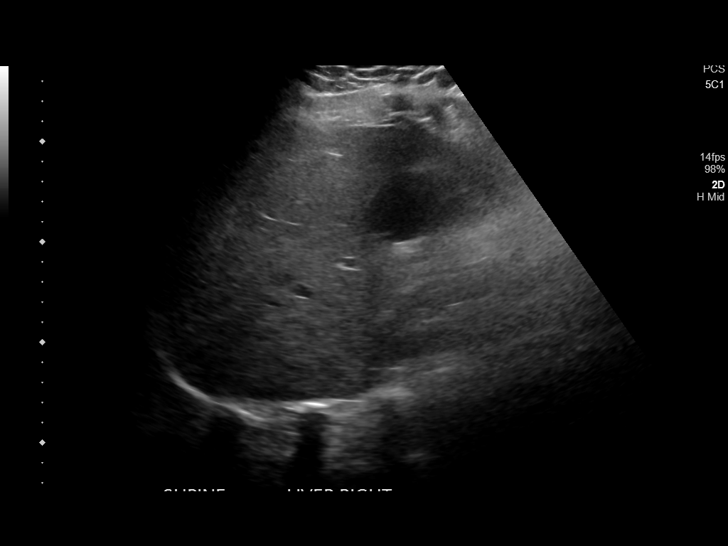
[im 31/42]
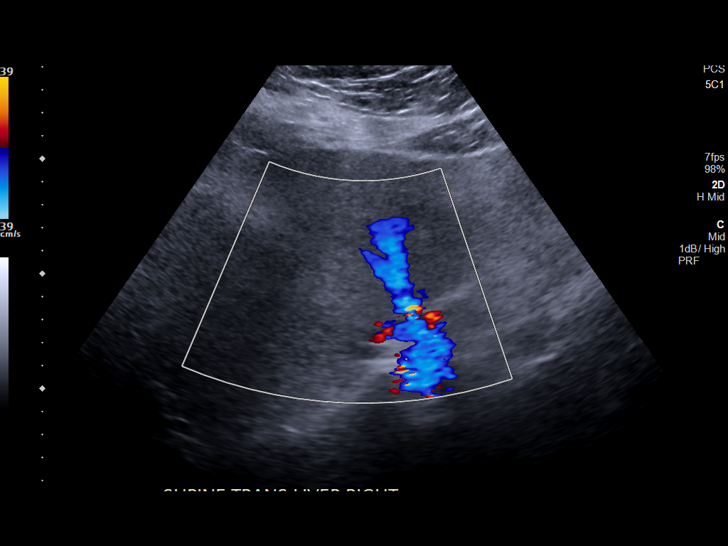
[im 35/42]
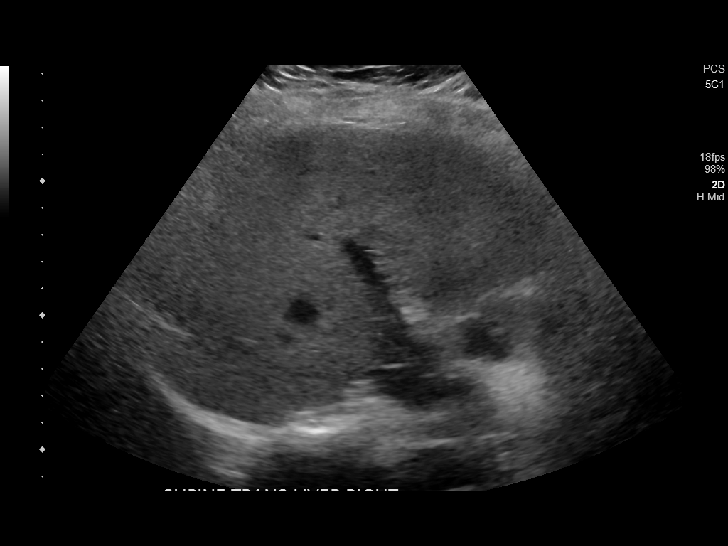
[im 38/42]
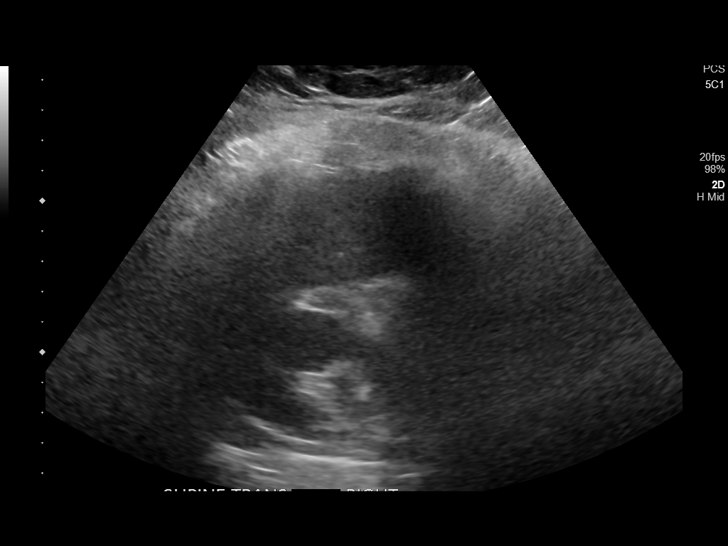
[im 42/42]
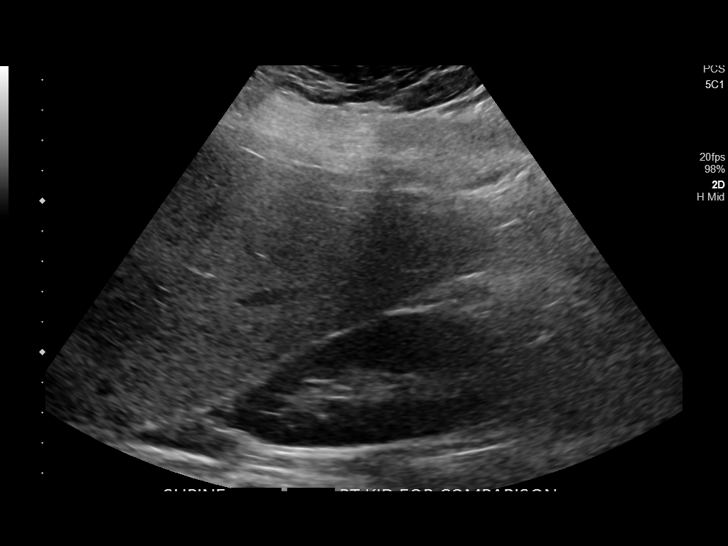

[14 of 25 positions shown; findings below may reference images not displayed]

FINDINGS: Gallbladder:

No gallstones or wall thickening visualized. No sonographic Murphy
sign noted by sonographer.

Common bile duct:

Diameter: 4 mm

Liver:

Slightly echogenic. No focal hepatic abnormality. Portal vein is
patent on color Doppler imaging with normal direction of blood flow
towards the liver.

Other: None.
IMPRESSION: 1. Negative for gallstones or biliary dilatation
2. Echogenic liver consistent with steatosis.

## 2021-01-24 IMAGING — US US MFM OB FOLLOW-UP
1 series · 13 of 28 positions shown · non-contrast
Comparison: none

[Series 1: us mfm ob follow-up · 13 of 47 slices shown]
[im 2/47]
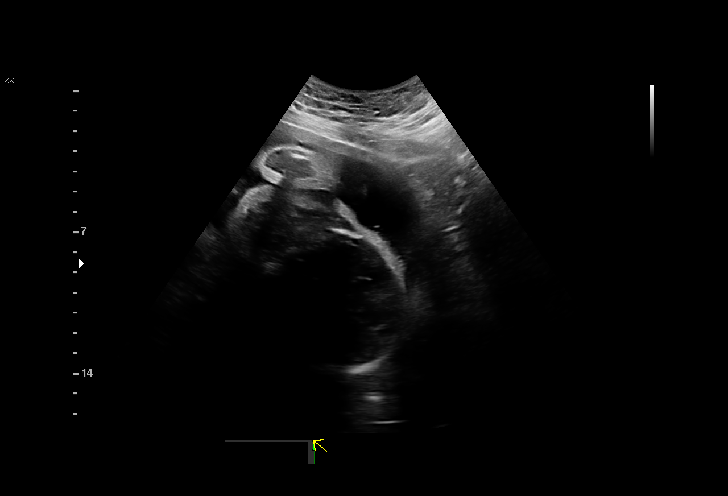
[im 6/47]
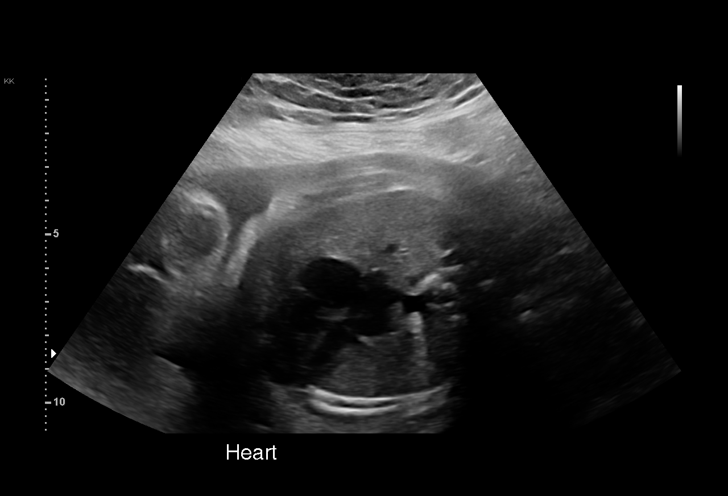
[im 9/47]
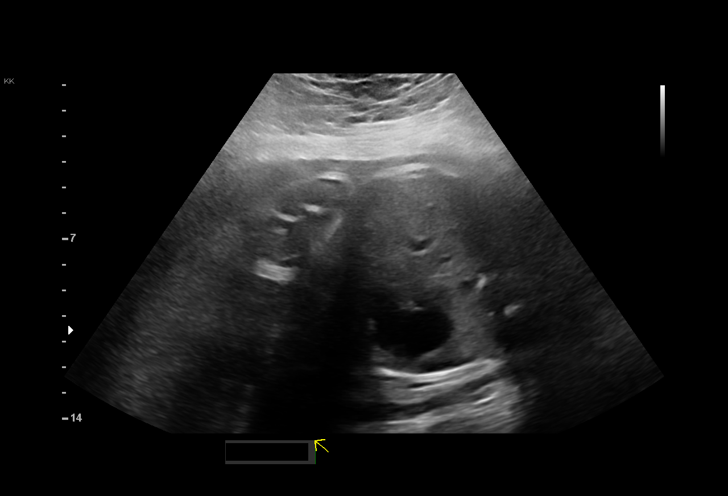
[im 12/47]
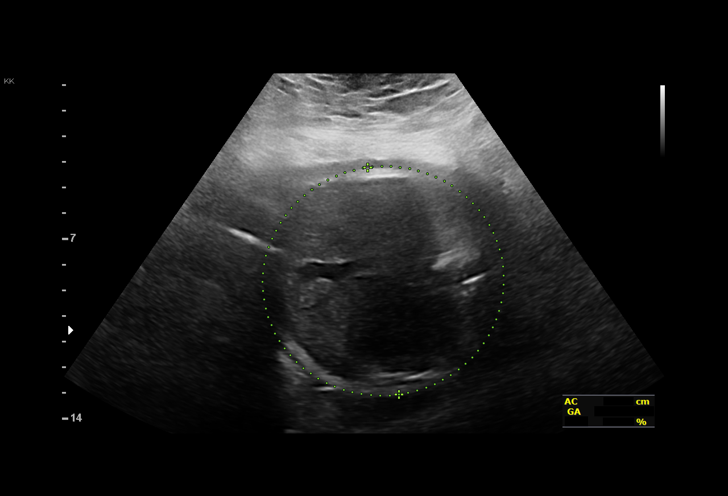
[im 16/47]
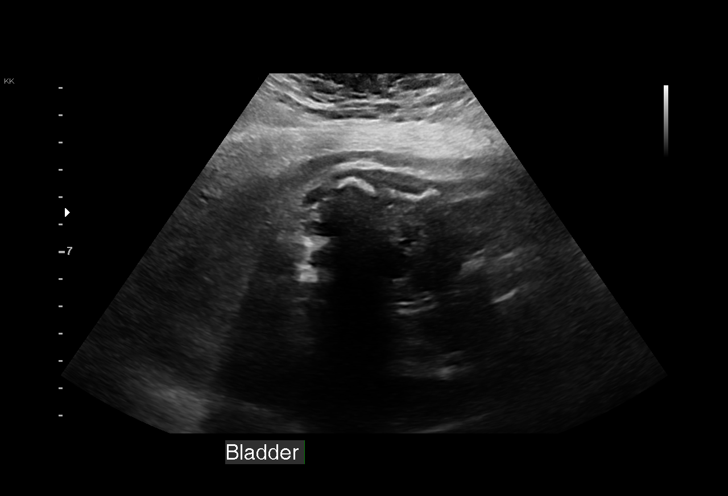
[im 19/47]
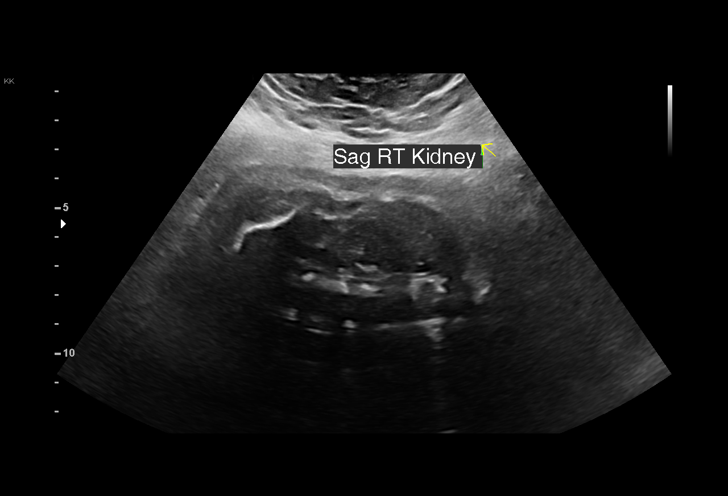
[im 24/47]
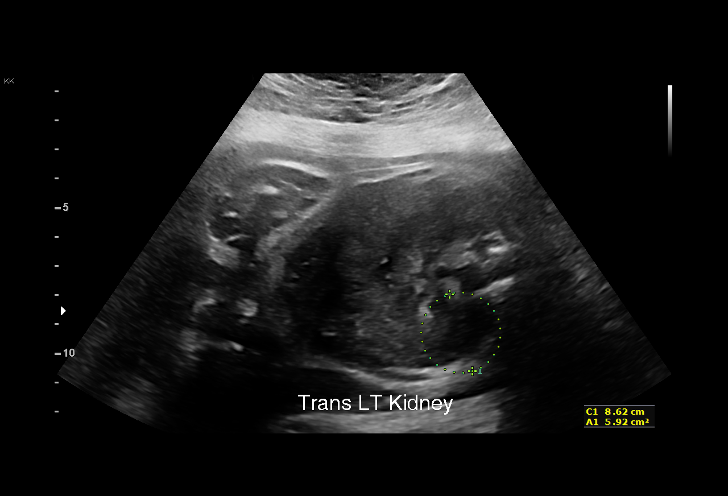
[im 28/47]
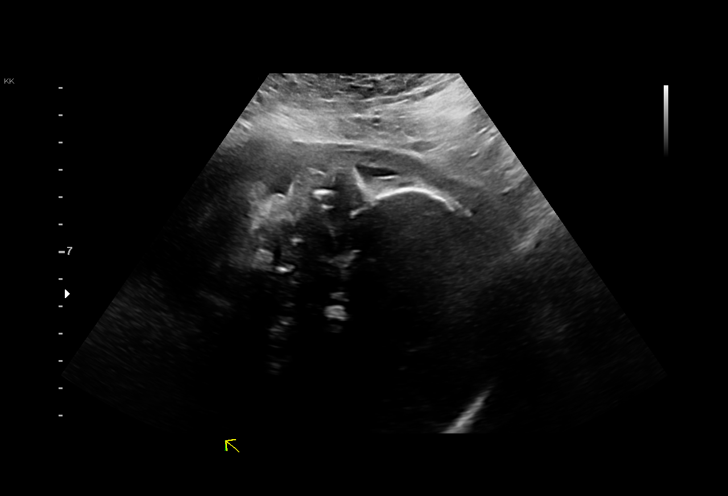
[im 31/47]
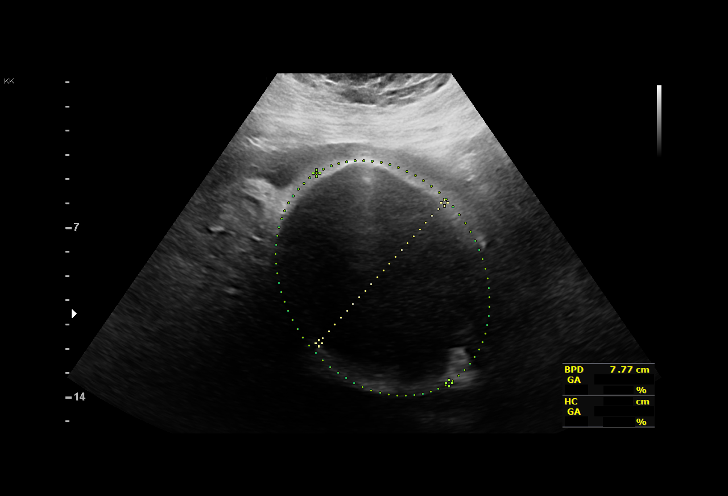
[im 35/47]
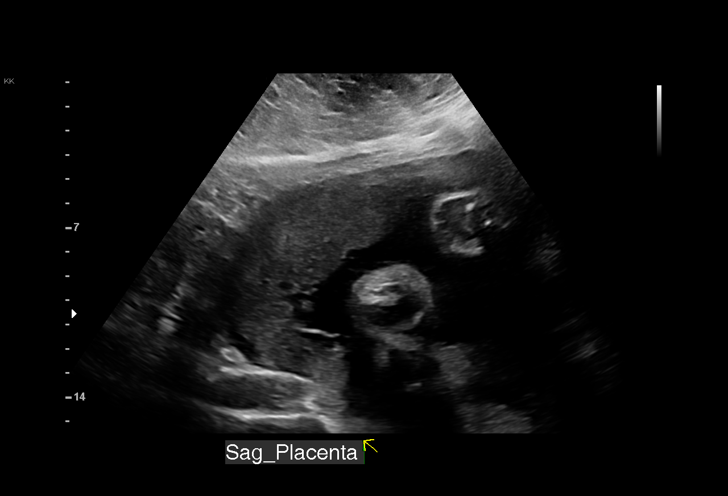
[im 38/47]
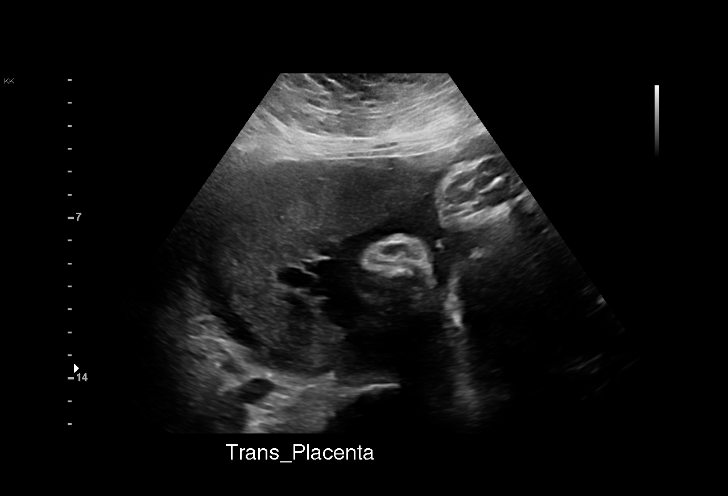
[im 41/47]
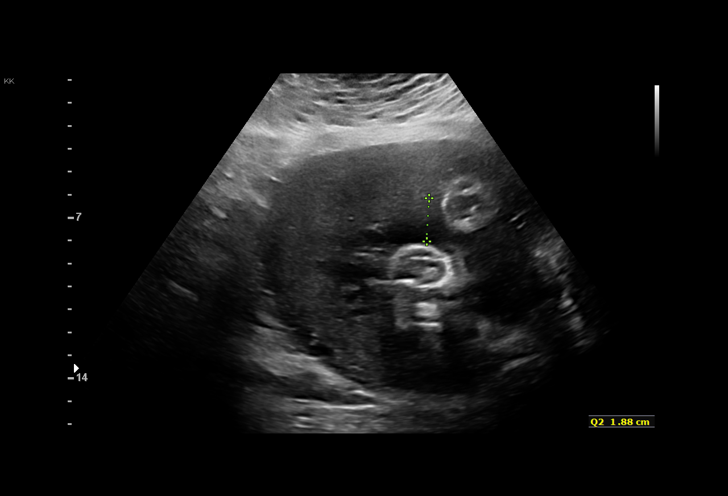
[im 45/47]
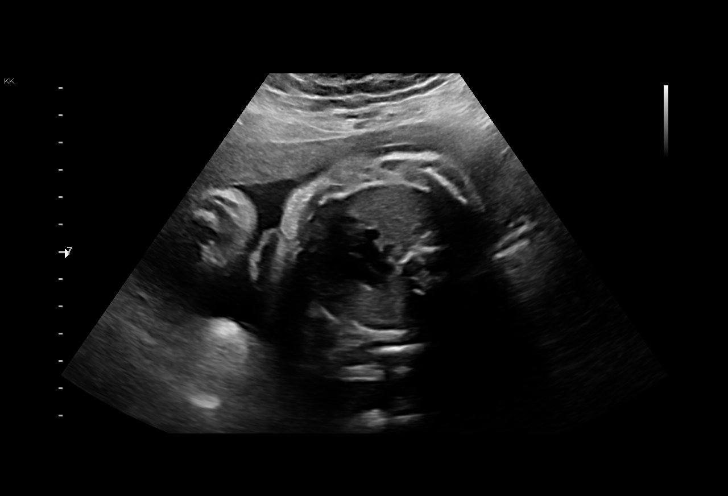

[13 of 28 positions shown; findings below may reference images not displayed]

----------------------------------------------------------------------

 ----------------------------------------------------------------------
Indications

  Obesity complicating pregnancy, third
  trimester
  Rh negative state in antepartum
  Low risk (NIPS:low risk , 7% FF, male)
  Encounter for other antenatal screening
  follow-up
  32 weeks gestation of pregnancy
  History of cesarean delivery, currently
  pregnant
 ----------------------------------------------------------------------
Vital Signs

 Weight (lb): 273                               Height:        6'
 BMI:
Fetal Evaluation

 Num Of Fetuses:         1
 Fetal Heart Rate(bpm):  148
 Cardiac Activity:       Observed
 Presentation:           Cephalic
 Placenta:               Posterior
 P. Cord Insertion:      Previously Visualized

 Amniotic Fluid
 AFI FV:      Within normal limits

 AFI Sum(cm)     %Tile       Largest Pocket(cm)
 11.89           31

 RUQ(cm)       RLQ(cm)       LUQ(cm)        LLQ(cm)

Biometry

 BPD:      77.2  mm     G. Age:  31w 0d         10  %    CI:        71.47   %    70 - 86
                                                         FL/HC:      21.3   %    19.1 -
 HC:      290.8  mm     G. Age:  32w 0d         11  %    HC/AC:      1.02        0.96 -
 AC:      285.9  mm     G. Age:  32w 4d         59  %    FL/BPD:     80.2   %    71 - 87
 FL:       61.9  mm     G. Age:  32w 1d         32  %    FL/AC:      21.7   %    20 - 24

 Est. FW:    6303  gm      4 lb 4 oz     38  %
OB History

 Gravidity:    2         Term:   1
 Living:       1
Gestational Age

 LMP:           32w 2d        Date:  07/28/19                 EDD:   05/03/20
 U/S Today:     32w 0d                                        EDD:   05/05/20
 Best:          32w 2d     Det. By:  LMP  (07/28/19)          EDD:   05/03/20
Anatomy

 Cranium:               Appears normal         LVOT:                   Previously seen
 Cavum:                 Previously seen        Aortic Arch:            Previously seen
 Ventricles:            Previously seen        Ductal Arch:            Previously seen
 Choroid Plexus:        Previously seen        Diaphragm:              Previously seen
 Cerebellum:            Previously seen        Stomach:                Appears normal, left
                                                                       sided
 Posterior Fossa:       Previously seen        Abdomen:                Appears normal
 Nuchal Fold:           Previously seen        Abdominal Wall:         Previously seen
 Face:                  Orbits and profile     Cord Vessels:           Previously seen
                        previously seen
 Lips:                  Previously seen        Kidneys:                Appear normal
 Palate:                Previously seen        Bladder:                Appears normal
 Thoracic:              Appears normal         Spine:                  Previously seen
 Heart:                 Appears normal         Upper Extremities:      Previously seen
                        (4CH, axis, and
                        situs)
 RVOT:                  Previously seen        Lower Extremities:      Previously seen

 Other:  Technically difficult due to maternal habitus and fetal position. Male
         gender. LT Heel previously visualized.
Cervix Uterus Adnexa

 Cervix
 Not visualized (advanced GA >45wks)
Impression

 Maternal obesity.Patient returned for fetal growth
 assessment. Amniotic fluid is normal and good fetal activity is
 seen. Fetal growth is appropriate for gestational age. Patient
 does not have gestational diabetes.
Recommendations

 Follow-up scans as clinically indicated.
                 Tuzo, Devontea

## 2021-04-18 ENCOUNTER — Encounter: Payer: Self-pay | Admitting: Radiology

## 2021-08-13 ENCOUNTER — Other Ambulatory Visit: Payer: Self-pay

## 2021-08-13 ENCOUNTER — Encounter: Payer: Self-pay | Admitting: Emergency Medicine

## 2021-08-13 ENCOUNTER — Emergency Department
Admission: EM | Admit: 2021-08-13 | Discharge: 2021-08-13 | Disposition: A | Discharge status: Home / Self Care | Payer: Medicaid Other | Attending: Emergency Medicine | Admitting: Emergency Medicine

## 2021-08-13 DIAGNOSIS — K0889 Other specified disorders of teeth and supporting structures: Secondary | ICD-10-CM | POA: Diagnosis present

## 2021-08-13 DIAGNOSIS — K029 Dental caries, unspecified: Secondary | ICD-10-CM | POA: Insufficient documentation

## 2021-08-13 DIAGNOSIS — Z87891 Personal history of nicotine dependence: Secondary | ICD-10-CM | POA: Insufficient documentation

## 2021-08-13 MED ORDER — IBUPROFEN 800 MG PO TABS: 800.0000 mg | ORAL_TABLET | Freq: Three times a day (TID) | ORAL | 0 refills | Status: AC | PRN

## 2021-08-13 MED ORDER — HYDROCODONE-ACETAMINOPHEN 5-325 MG PO TABS: 2.0000 | ORAL_TABLET | Freq: Four times a day (QID) | ORAL | 0 refills | Status: DC | PRN

## 2021-08-13 MED ORDER — IBUPROFEN 800 MG PO TABS
800.0000 mg | ORAL_TABLET | Freq: Once | ORAL | Status: AC
  Administered 2021-08-13: 800 mg via ORAL
  Filled 2021-08-13: qty 1

## 2021-08-13 MED ORDER — ONDANSETRON 4 MG PO TBDP: 4.0000 mg | ORAL_TABLET | Freq: Four times a day (QID) | ORAL | 0 refills | Status: AC | PRN

## 2021-08-13 MED ORDER — PENICILLIN V POTASSIUM 500 MG PO TABS
500.0000 mg | ORAL_TABLET | Freq: Four times a day (QID) | ORAL | 0 refills | Status: DC
Start: 2021-08-13 — End: 2021-09-05

## 2021-08-13 MED ORDER — PENICILLIN V POTASSIUM 250 MG PO TABS
500.0000 mg | ORAL_TABLET | Freq: Once | ORAL | Status: AC
  Administered 2021-08-13: 500 mg via ORAL
  Filled 2021-08-13: qty 2

## 2021-08-13 NOTE — ED Triage Notes (Signed)
Pt arrived via POV with reports of cracked tooth on R upper side x 1 week, per pt the pain is unbearable tonight.  Pt reports taking ibuprofen and tylenol with no relief.  Pt has dentist appt on Monday.

## 2021-08-13 NOTE — Discharge Instructions (Addendum)

## 2021-08-13 NOTE — ED Provider Notes (Signed)
[facility] Emergency Department Provider Note  ____________________________________________   Event Date/Time   First MD Initiated Contact with Patient 08/13/21 [phone]     (approximate)  I have reviewed the triage vital signs and the nursing notes.   HISTORY  Chief Complaint Dental Pain    HPI Adrienne Berry is a 30 y.o. female with no significant past medical history who presents to the emergency department with 1 week of severe, throbbing right upper dental pain that worsened tonight.  No fever, facial swelling or redness.  No difficulty swallowing, speaking or breathing.  Does not have a dentist.  Is taking Tylenol, ibuprofen over-the-counter without any relief.  States she has a broken tooth which is a cause of her pain.        Past Medical History:  Diagnosis Date   Abnormal uterine bleeding 08/19/2019   Anemia     Patient Active Problem List   Diagnosis Date Noted   History of cesarean delivery 04/27/2020   Hematochezia 10/14/2019   Nausea and vomiting during pregnancy 09/21/2019   Maternal obesity, antepartum 09/09/2019    Past Surgical History:  Procedure Laterality Date   CESAREAN SECTION     CESAREAN SECTION N/A 04/27/2020   Procedure: CESAREAN SECTION;  Surgeon: Kathrynn Running, MD;  Location: [facility] LD ORS;  Service: Obstetrics;  Laterality: N/A;    Prior to Admission medications   Medication Sig Start Date End Date Taking? Authorizing Provider  HYDROcodone-acetaminophen (NORCO/VICODIN) 5-325 MG tablet Take 2 tablets by mouth every 6 (six) hours as needed. 08/13/21  Yes Javaun Dimperio, Layla Maw, DO  ibuprofen (ADVIL) 800 MG tablet Take 1 tablet (800 mg total) by mouth every 8 (eight) hours as needed for mild pain. 08/13/21  Yes Iyania Denne N, DO  ondansetron (ZOFRAN ODT) 4 MG disintegrating tablet Take 1 tablet (4 mg total) by mouth every 6 (six) hours as needed for nausea or vomiting. 08/13/21  Yes Anila Bojarski, Layla Maw, DO  penicillin v  potassium (VEETID) 500 MG tablet Take 1 tablet (500 mg total) by mouth 4 (four) times daily. 08/13/21  Yes Lashawn Orrego N, DO  Norethin Ace-Eth Estrad-FE (TAYTULLA) 1-20 MG-MCG(24) CAPS Take 1 tablet by mouth daily. 06/02/20    Bing, MD  prenatal vitamin w/FE, FA (NATACHEW) 29-1 MG CHEW chewable tablet Chew 1 tablet by mouth daily at 12 noon. Taking Prenatal Vitamin with iron gummies.    [provider]    Allergies Patient has no known allergies.  Family History  Problem Relation Age of Onset   Ovarian cysts Mother    Diabetes Mother    Ovarian cysts Maternal Aunt     Social History Social History   Tobacco Use   Smoking status: Former    Packs/day: 0.50    Years: 12.00    Pack years: 6.00    Types: Cigarettes   Smokeless tobacco: Never  Vaping Use   Vaping Use: Never used  Substance Use Topics   Alcohol use: Not Currently   Drug use: Never    Review of Systems Constitutional: No fever. Eyes: No visual changes. ENT: No sore throat. Cardiovascular: Denies chest pain. Respiratory: Denies shortness of breath. Gastrointestinal: No nausea, vomiting, diarrhea. Genitourinary: Negative for dysuria. Musculoskeletal: Negative for back pain. Skin: Negative for rash. Neurological: Negative for focal weakness or numbness.  ____________________________________________   PHYSICAL EXAM:  VITAL SIGNS: ED Triage Vitals  Enc Vitals Group     BP 08/13/21 0238 (!) 145/88  Pulse Rate 08/13/21 0238 97     Resp 08/13/21 0238 18     Temp 08/13/21 0238 98.2 F (36.8 C)     Temp Source 08/13/21 0238 Oral     SpO2 08/13/21 0238 98 %     Weight 08/13/21 0234 245 lb (111.1 kg)     Height 08/13/21 0234 6' (1.829 m)     Head Circumference --      Peak Flow --      Pain Score 08/13/21 0234 10     Pain Loc --      Pain Edu? --      Excl. in GC? --    CONSTITUTIONAL: Alert and oriented and responds appropriately to questions.  Appears uncomfortable, tearful,  afebrile, nontoxic HEAD: Normocephalic EYES: Conjunctivae clear, pupils appear equal, EOM appear intact ENT: normal nose; moist mucous membranes; No pharyngeal erythema or petechiae, no tonsillar hypertrophy or exudate, no uvular deviation, no unilateral swelling, no trismus or drooling, no muffled voice, normal phonation, no stridor, + dental caries present, patient has a fractured right upper first molar, no drainable dental abscess noted, no Ludwig's angina, tongue sits flat in the bottom of the mouth, no angioedema, no facial erythema or warmth, no facial swelling; no pain with movement of the neck, no cervical LAD. NECK: Supple, normal ROM CARD: RRR; S1 and S2 appreciated; no murmurs, no clicks, no rubs, no gallops RESP: Normal chest excursion without splinting or tachypnea; breath sounds clear and equal bilaterally; no wheezes, no rhonchi, no rales, no hypoxia or respiratory distress, speaking full sentences ABD/GI: Normal bowel sounds; non-distended; soft, non-tender, no rebound, no guarding, no peritoneal signs, no hepatosplenomegaly BACK: The back appears normal EXT: Normal ROM in all joints; no deformity noted, no edema; no cyanosis SKIN: Normal color for age and race; warm; no rash on exposed skin NEURO: Moves all extremities equally PSYCH: The patient's mood and manner are appropriate.  ____________________________________________   LABS (all labs ordered are listed, but only abnormal results are displayed)  Labs Reviewed - No data to display ____________________________________________  EKG   ____________________________________________  RADIOLOGY I, Candita Borenstein, personally viewed and evaluated these images (plain radiographs) as part of my medical decision making, as well as reviewing the written report by the radiologist.  ED MD interpretation:    Official radiology report(s): No results  found.  ____________________________________________   PROCEDURES  Procedure(s) performed (including Critical Care):  Procedures    ____________________________________________   INITIAL IMPRESSION / ASSESSMENT AND PLAN / ED COURSE  As part of my medical decision making, I reviewed the following data within the electronic MEDICAL RECORD NUMBER Nursing notes reviewed and incorporated, Old chart reviewed, Notes from prior ED visits, and [place] Controlled Substance Database         Patient here with pain from dental caries.  No sign of drainable abscess or Ludwig's.  No facial cellulitis.  She has been trying over-the-counter medications without relief.  Will discharge on prophylactic antibiotics and with pain medication.  Given outpatient dental follow-up information.  At this time, I do not feel there is any life-threatening condition present. I have reviewed, interpreted and discussed all results (EKG, imaging, lab, urine as appropriate) and exam findings with patient/family. I have reviewed nursing notes and appropriate previous records.  I feel the patient is safe to be discharged home without further emergent workup and can continue workup as an outpatient as needed. Discussed usual and customary return precautions. Patient/family verbalize understanding and are comfortable with  this plan.  Outpatient follow-up has been provided as needed. All questions have been answered.  ____________________________________________   FINAL CLINICAL IMPRESSION(S) / ED DIAGNOSES  Final diagnoses:  Pain due to dental caries     ED Discharge Orders          Ordered    penicillin v potassium (VEETID) 500 MG tablet  4 times daily        08/13/21 0543    ibuprofen (ADVIL) 800 MG tablet  Every 8 hours PRN        08/13/21 0543    HYDROcodone-acetaminophen (NORCO/VICODIN) 5-325 MG tablet  Every 6 hours PRN        08/13/21 0543    ondansetron (ZOFRAN ODT) 4 MG disintegrating tablet  Every 6 hours PRN         08/13/21 0543            *Please note:  Adrienne Berry was evaluated in Emergency Department on 08/13/2021 for the symptoms described in the history of present illness. She was evaluated in the context of the global COVID-19 pandemic, which necessitated consideration that the patient might be at risk for infection with the SARS-CoV-2 virus that causes COVID-19. Institutional protocols and algorithms that pertain to the evaluation of patients at risk for COVID-19 are in a state of rapid change based on information released by regulatory bodies including the CDC and federal and state organizations. These policies and algorithms were followed during the patient's care in the ED.  Some ED evaluations and interventions may be delayed as a result of limited staffing during and the pandemic.*   Note:  This document was prepared using Dragon voice recognition software and may include unintentional dictation errors.    Brittani Purdum, Layla Maw, DO 08/13/21 [phone]

## 2021-09-05 ENCOUNTER — Other Ambulatory Visit: Payer: Self-pay

## 2021-09-05 ENCOUNTER — Emergency Department: Payer: Medicaid Other

## 2021-09-05 ENCOUNTER — Emergency Department
Admission: EM | Admit: 2021-09-05 | Discharge: 2021-09-05 | Disposition: A | Discharge status: Home / Self Care | Payer: Medicaid Other | Attending: Emergency Medicine | Admitting: Emergency Medicine

## 2021-09-05 DIAGNOSIS — Z87891 Personal history of nicotine dependence: Secondary | ICD-10-CM | POA: Diagnosis not present

## 2021-09-05 DIAGNOSIS — M5441 Lumbago with sciatica, right side: Secondary | ICD-10-CM | POA: Diagnosis not present

## 2021-09-05 DIAGNOSIS — M5431 Sciatica, right side: Secondary | ICD-10-CM

## 2021-09-05 DIAGNOSIS — M545 Low back pain, unspecified: Secondary | ICD-10-CM | POA: Diagnosis present

## 2021-09-05 LAB — URINALYSIS, ROUTINE W REFLEX MICROSCOPIC
Bilirubin Urine: NEGATIVE
Glucose, UA: NEGATIVE mg/dL
Hgb urine dipstick: NEGATIVE
Ketones, ur: NEGATIVE mg/dL
Leukocytes,Ua: NEGATIVE
Nitrite: NEGATIVE
Protein, ur: NEGATIVE mg/dL
Specific Gravity, Urine: 1.01 (ref 1.005–1.030)
pH: 7 (ref 5.0–8.0)

## 2021-09-05 LAB — PREGNANCY, URINE: Preg Test, Ur: NEGATIVE

## 2021-09-05 MED ORDER — HYDROCODONE-ACETAMINOPHEN 5-325 MG PO TABS: 1.0000 | ORAL_TABLET | Freq: Four times a day (QID) | ORAL | 0 refills | Status: AC | PRN

## 2021-09-05 MED ORDER — KETOROLAC TROMETHAMINE 60 MG/2ML IM SOLN
30.0000 mg | Freq: Once | INTRAMUSCULAR | Status: AC
  Administered 2021-09-05: 30 mg via INTRAMUSCULAR
  Filled 2021-09-05: qty 2

## 2021-09-05 MED ORDER — CYCLOBENZAPRINE HCL 10 MG PO TABS: 10.0000 mg | ORAL_TABLET | Freq: Three times a day (TID) | ORAL | 0 refills | Status: AC | PRN

## 2021-09-05 MED ORDER — OXYCODONE HCL 5 MG PO TABS
5.0000 mg | ORAL_TABLET | Freq: Once | ORAL | Status: AC
Start: 2021-09-05 — End: 2021-09-05
  Administered 2021-09-05: 5 mg via ORAL
  Filled 2021-09-05: qty 1

## 2021-09-05 MED ORDER — PREDNISONE 10 MG (21) PO TBPK: ORAL_TABLET | ORAL | 0 refills | Status: AC

## 2021-09-05 NOTE — ED Notes (Addendum)
See triage note presents with lower back pain for couple of days  states pain is worse this am  pain is moving into right leg  denies any fall  but states she worked long hours this weekend

## 2021-09-05 NOTE — ED Triage Notes (Signed)
Pt to ED for lower back pain that started a week ago, reports difficulty ambulating

## 2021-09-05 NOTE — ED Provider Notes (Signed)
[facility] Emergency Department Provider Note ____________________________________________  Time seen: Approximately 10:07 AM  I have reviewed the triage vital signs and the nursing notes.  HISTORY  Chief Complaint Back Pain   HPI Adrienne Berry is a 30 y.o. female presenting to the emergency department for treatment and evaluation of low back pain started about a week ago.  Pain radiates down the right leg just past the knee.  Pain increases with movement.  She has had sciatica in the past, but has never lasted this long and typically is resolved with ibuprofen, rest and heat/ice.  None of these measures have helped this time.  She denies injury.  No urinary symptoms.  Past Medical History:  Diagnosis Date   Abnormal uterine bleeding 08/19/2019   Anemia     Patient Active Problem List   Diagnosis Date Noted   History of cesarean delivery 04/27/2020   Hematochezia 10/14/2019   Nausea and vomiting during pregnancy 09/21/2019   Maternal obesity, antepartum 09/09/2019    Past Surgical History:  Procedure Laterality Date   CESAREAN SECTION     CESAREAN SECTION N/A 04/27/2020   Procedure: CESAREAN SECTION;  Surgeon: Kathrynn Running, MD;  Location: [facility] LD ORS;  Service: Obstetrics;  Laterality: N/A;    Prior to Admission medications   Medication Sig Start Date End Date Taking? Authorizing Provider  cyclobenzaprine (FLEXERIL) 10 MG tablet Take 1 tablet (10 mg total) by mouth 3 (three) times daily as needed. 09/05/21  Yes Johnthan Axtman B, FNP  HYDROcodone-acetaminophen (NORCO/VICODIN) 5-325 MG tablet Take 1 tablet by mouth every 6 (six) hours as needed for up to 3 days for severe pain. 09/05/21 09/08/21 Yes Jordani Nunn B, FNP  predniSONE (STERAPRED UNI-PAK 21 TAB) 10 MG (21) TBPK tablet Take 6 tablets on the first day and decrease by 1 tablet each day until finished. 09/05/21  Yes Deaven Urwin B, FNP  ibuprofen (ADVIL) 800 MG tablet Take 1 tablet (800  mg total) by mouth every 8 (eight) hours as needed for mild pain. 08/13/21   Ward, Layla Maw, DO  Norethin Ace-Eth Estrad-FE (TAYTULLA) 1-20 MG-MCG(24) CAPS Take 1 tablet by mouth daily. 06/02/20   [place] Bing, MD  ondansetron (ZOFRAN ODT) 4 MG disintegrating tablet Take 1 tablet (4 mg total) by mouth every 6 (six) hours as needed for nausea or vomiting. 08/13/21   Ward, Layla Maw, DO  prenatal vitamin w/FE, FA (NATACHEW) 29-1 MG CHEW chewable tablet Chew 1 tablet by mouth daily at 12 noon. Taking Prenatal Vitamin with iron gummies.    [provider]    Allergies Patient has no known allergies.  Family History  Problem Relation Age of Onset   Ovarian cysts Mother    Diabetes Mother    Ovarian cysts Maternal Aunt     Social History Social History   Tobacco Use   Smoking status: Former    Packs/day: 0.50    Years: 12.00    Pack years: 6.00    Types: Cigarettes   Smokeless tobacco: Never  Vaping Use   Vaping Use: Never used  Substance Use Topics   Alcohol use: Not Currently   Drug use: Never    Review of Systems Constitutional: Well appearing. Respiratory: Negative for dyspnea. Cardiovascular: Negative for change in skin temperature or color. Musculoskeletal:   Negative for chronic steroid use   Negative for trauma in the presence of osteoporosis  Negative for age over 39 and trauma.  Negative for constitutional symptoms, or history  of cancer   Negative for pain worse at night. Skin: Negative for rash, lesion, or wound.  Genitourinary: Negative for urinary retention. Rectal: Negative for fecal incontinence or new onset constipation/bowel habit changes. Hematological/Immunilogical: Negative for immunosuppression, IV drug use, or fever Neurological: Positive for burning, tingling, numb, electric, radiating pain in the right lower extremity.                        Negative for saddle anesthesia.                        Negative for focal neurologic deficit,  progressive or disabling symptoms             Negative for saddle anesthesia. ____________________________________________   PHYSICAL EXAM:  VITAL SIGNS: ED Triage Vitals  Enc Vitals Group     BP 09/05/21 0926 117/64     Pulse Rate 09/05/21 0926 77     Resp 09/05/21 0926 18     Temp 09/05/21 0926 98.3 F (36.8 C)     Temp Source 09/05/21 0926 Oral     SpO2 09/05/21 0926 99 %     Weight 09/05/21 0934 240 lb (108.9 kg)     Height 09/05/21 0934 6' (1.829 m)     Head Circumference --      Peak Flow --      Pain Score 09/05/21 0934 8     Pain Loc --      Pain Edu? --      Excl. in GC? --     Constitutional: Alert and oriented. Well appearing and in no acute distress. Eyes: Conjunctivae are clear without discharge or drainage.  Head: Atraumatic. Neck: Full, active range of motion. Respiratory: Respirations even and unlabored. Musculoskeletal: Decreased ROM of the back and extremities, Strength 5/5 of the lower extremities as tested. Neurologic: Reflexes of the lower extremities are 2+.  Positive straight leg raise on the right side. Skin: Atraumatic.  Psychiatric: Behavior and affect are normal.  ____________________________________________   LABS (all labs ordered are listed, but only abnormal results are displayed)  Labs Reviewed  URINALYSIS, ROUTINE W REFLEX MICROSCOPIC - Abnormal; Notable for the following components:      Result Value   Color, Urine STRAW (*)    APPearance CLEAR (*)    All other components within normal limits  PREGNANCY, URINE   ____________________________________________  RADIOLOGY  Image of the lumbar spine negative for acute concerns. ____________________________________________   PROCEDURES  Procedure(s) performed:  Procedures ____________________________________________   INITIAL IMPRESSION / ASSESSMENT AND PLAN / ED COURSE  Adrienne Berry is a 30 y.o. female presenting to the emergency department for nontraumatic lower back  pain that radiates into the right lower extremity.  See HPI for further details.  Image of lumbar spine is negative for acute findings.  Medications given while here with significant improvement.  Plan will be to discharge her home with prednisone, Flexeril, and pain medication.  She is to follow-up with her primary care provider if symptoms are not improving with medications.  She is to return to the emergency department for symptoms of change or worsen if she is unable to see primary care.  Medications  oxyCODONE (Oxy IR/ROXICODONE) immediate release tablet 5 mg (5 mg Oral Given 09/05/21 1025)  ketorolac (TORADOL) injection 30 mg (30 mg Intramuscular Given 09/05/21 1026)    ED Discharge Orders          Ordered  predniSONE (STERAPRED UNI-PAK 21 TAB) 10 MG (21) TBPK tablet        09/05/21 1155    cyclobenzaprine (FLEXERIL) 10 MG tablet  3 times daily PRN        09/05/21 1155    HYDROcodone-acetaminophen (NORCO/VICODIN) 5-325 MG tablet  Every 6 hours PRN        09/05/21 1155               Pertinent labs & imaging results that were available during my care of the patient were reviewed by me and considered in my medical decision making (see chart for details).   _________________________________________   FINAL CLINICAL IMPRESSION(S) / ED DIAGNOSES   Final diagnoses:  Sciatica of right side     If controlled substance prescribed during this visit, 12 month history viewed on the [facility] prior to issuing an initial prescription for Schedule II or III opiod.    Chinita Pester, FNP 09/05/21 1350    Georga Hacking, MD 09/05/21 [phone]
